# Patient Record
Sex: Female | Born: 1965
Health system: Southern US, Community
[De-identification: ages and names within clinical notes are randomized; demographics above are authoritative.]

## PROBLEM LIST (undated history)

## (undated) DIAGNOSIS — I201 Angina pectoris with documented spasm: Principal | ICD-10-CM

## (undated) HISTORY — DX: Angina pectoris with documented spasm: I20.1

---

## 1998-05-06 ENCOUNTER — Emergency Department (HOSPITAL_COMMUNITY): Admission: EM | Admit: 1998-05-06 | Discharge: 1998-05-06 | Payer: Self-pay | Admitting: Emergency Medicine

## 2000-02-17 ENCOUNTER — Inpatient Hospital Stay (HOSPITAL_COMMUNITY): Admission: AD | Admit: 2000-02-17 | Discharge: 2000-02-17 | Payer: Self-pay | Admitting: *Deleted

## 2000-02-27 ENCOUNTER — Inpatient Hospital Stay (HOSPITAL_COMMUNITY): Admission: AD | Admit: 2000-02-27 | Discharge: 2000-02-29 | Payer: Self-pay | Admitting: *Deleted

## 2000-05-24 ENCOUNTER — Encounter: Admission: RE | Admit: 2000-05-24 | Discharge: 2000-05-24 | Payer: Self-pay | Admitting: *Deleted

## 2000-05-24 ENCOUNTER — Encounter: Payer: Self-pay | Admitting: *Deleted

## 2001-02-19 ENCOUNTER — Emergency Department (HOSPITAL_COMMUNITY): Admission: EM | Admit: 2001-02-19 | Discharge: 2001-02-19 | Payer: Self-pay | Admitting: Emergency Medicine

## 2001-08-16 ENCOUNTER — Encounter: Payer: Self-pay | Admitting: *Deleted

## 2001-08-16 ENCOUNTER — Ambulatory Visit (HOSPITAL_COMMUNITY): Admission: RE | Admit: 2001-08-16 | Discharge: 2001-08-16 | Payer: Self-pay | Admitting: *Deleted

## 2004-07-03 ENCOUNTER — Other Ambulatory Visit: Admission: RE | Admit: 2004-07-03 | Discharge: 2004-07-03 | Payer: Self-pay | Admitting: Family Medicine

## 2004-07-16 ENCOUNTER — Ambulatory Visit (HOSPITAL_COMMUNITY): Admission: RE | Admit: 2004-07-16 | Discharge: 2004-07-16 | Payer: Self-pay | Admitting: Family Medicine

## 2005-02-02 ENCOUNTER — Other Ambulatory Visit: Admission: RE | Admit: 2005-02-02 | Discharge: 2005-02-02 | Payer: Self-pay | Admitting: Family Medicine

## 2005-07-22 ENCOUNTER — Ambulatory Visit (HOSPITAL_COMMUNITY): Admission: RE | Admit: 2005-07-22 | Discharge: 2005-07-22 | Payer: Self-pay | Admitting: Family Medicine

## 2006-07-27 ENCOUNTER — Ambulatory Visit (HOSPITAL_COMMUNITY): Admission: RE | Admit: 2006-07-27 | Discharge: 2006-07-27 | Payer: Self-pay | Admitting: Family Medicine

## 2007-08-15 ENCOUNTER — Ambulatory Visit (HOSPITAL_COMMUNITY): Admission: RE | Admit: 2007-08-15 | Discharge: 2007-08-15 | Payer: Self-pay | Admitting: Family Medicine

## 2008-07-06 ENCOUNTER — Other Ambulatory Visit: Admission: RE | Admit: 2008-07-06 | Discharge: 2008-07-06 | Payer: Self-pay | Admitting: Family Medicine

## 2008-08-29 ENCOUNTER — Ambulatory Visit (HOSPITAL_COMMUNITY): Admission: RE | Admit: 2008-08-29 | Discharge: 2008-08-29 | Payer: Self-pay | Admitting: Family Medicine

## 2009-07-15 ENCOUNTER — Other Ambulatory Visit: Admission: RE | Admit: 2009-07-15 | Discharge: 2009-07-15 | Payer: Self-pay | Admitting: Family Medicine

## 2009-09-20 ENCOUNTER — Ambulatory Visit (HOSPITAL_COMMUNITY): Admission: RE | Admit: 2009-09-20 | Discharge: 2009-09-20 | Payer: Self-pay | Admitting: Family Medicine

## 2010-07-17 ENCOUNTER — Other Ambulatory Visit: Admission: RE | Admit: 2010-07-17 | Discharge: 2010-07-17 | Payer: Self-pay | Admitting: Family Medicine

## 2010-09-18 ENCOUNTER — Other Ambulatory Visit (HOSPITAL_COMMUNITY): Payer: Self-pay | Admitting: Family Medicine

## 2010-09-18 DIAGNOSIS — Z0000 Encounter for general adult medical examination without abnormal findings: Secondary | ICD-10-CM

## 2010-09-18 DIAGNOSIS — Z1231 Encounter for screening mammogram for malignant neoplasm of breast: Secondary | ICD-10-CM

## 2010-09-21 ENCOUNTER — Encounter: Payer: Self-pay | Admitting: Family Medicine

## 2010-10-01 ENCOUNTER — Inpatient Hospital Stay (HOSPITAL_COMMUNITY): Admission: RE | Admit: 2010-10-01 | Payer: Self-pay | Source: Ambulatory Visit

## 2010-10-01 ENCOUNTER — Ambulatory Visit (HOSPITAL_COMMUNITY)
Admission: RE | Admit: 2010-10-01 | Discharge: 2010-10-01 | Disposition: A | Discharge status: Home / Self Care | Payer: Self-pay | Source: Ambulatory Visit | Attending: Family Medicine | Admitting: Family Medicine

## 2010-10-01 ENCOUNTER — Ambulatory Visit (HOSPITAL_COMMUNITY): Admission: RE | Admit: 2010-10-01 | Payer: Self-pay | Source: Home / Self Care | Admitting: Family Medicine

## 2010-10-01 DIAGNOSIS — Z0000 Encounter for general adult medical examination without abnormal findings: Secondary | ICD-10-CM

## 2010-10-27 ENCOUNTER — Ambulatory Visit (HOSPITAL_COMMUNITY): Payer: Self-pay

## 2010-11-04 ENCOUNTER — Other Ambulatory Visit (HOSPITAL_COMMUNITY): Payer: Self-pay | Admitting: Family Medicine

## 2010-11-04 ENCOUNTER — Ambulatory Visit (HOSPITAL_COMMUNITY)
Admission: RE | Admit: 2010-11-04 | Discharge: 2010-11-04 | Disposition: A | Discharge status: Home / Self Care | Payer: BC Managed Care – PPO | Source: Ambulatory Visit

## 2010-11-04 ENCOUNTER — Ambulatory Visit (HOSPITAL_COMMUNITY)
Admission: RE | Admit: 2010-11-04 | Discharge: 2010-11-04 | Disposition: A | Discharge status: Home / Self Care | Payer: BC Managed Care – PPO | Source: Ambulatory Visit | Attending: Family Medicine | Admitting: Family Medicine

## 2010-11-04 DIAGNOSIS — Z1231 Encounter for screening mammogram for malignant neoplasm of breast: Secondary | ICD-10-CM

## 2010-11-06 ENCOUNTER — Other Ambulatory Visit: Payer: Self-pay | Admitting: Family Medicine

## 2010-11-06 DIAGNOSIS — R928 Other abnormal and inconclusive findings on diagnostic imaging of breast: Secondary | ICD-10-CM

## 2010-11-14 ENCOUNTER — Other Ambulatory Visit: Payer: Self-pay | Admitting: Family Medicine

## 2010-11-14 ENCOUNTER — Ambulatory Visit
Admission: RE | Admit: 2010-11-14 | Discharge: 2010-11-14 | Disposition: A | Discharge status: Home / Self Care | Payer: BC Managed Care – PPO | Source: Ambulatory Visit | Attending: Family Medicine | Admitting: Family Medicine

## 2010-11-14 DIAGNOSIS — R928 Other abnormal and inconclusive findings on diagnostic imaging of breast: Secondary | ICD-10-CM

## 2011-01-16 NOTE — Discharge Summary (Signed)
[facility]  Patient:    Selena Vega, Selena Vega              MRN: [id number] Adm. Date:  [id number] Disc. Date: [id number] Attending:  Ardeen Fillers                           Discharge Summary  DISCHARGE DIAGNOSES:          1. Intrauterine pregnancy at [redacted] weeks                                  gestational age.                               2. Footling breech presentation.                               3. Rh positive.                               4. Advanced maternal age.  OPERATIVE PROCEDURES:         Primary low transverse cesarean section on February 27, 2000.  HISTORY OF PRESENT ILLNESS:   A 45 year old woman, G3, P0-0-2-0, [facility] March 07, 2000, with dates established by 8 week ultrasound, admitted for primary cesarean section because of persistent breech presentation.  External cephalic version was planned, but because of a nuchal cord, this was canceled.  The breech has been confirmed as recently as February 26, 2000.  The antenatal course was also remarkable for:  1. Small for gestational age fetus.  The ultrasound estimated fetal weight on February 12, 2000, at the 36th percentile.  The ultrasound on February 26, 2000, was in the 17th percentile. The patient reported good fetal activity.  2. Recurrent urinary tract infections during pregnancy.  The patient had been maintained on Macrobid daily prophylactically.  3. Advanced maternal age.  Amniocentesis showed normal female chromosomes.  HOSPITAL COURSE:  The patient was admitted on February 27, 2000, and taken to the operating room where she underwent primary low transverse cesarean section by Sung Amabile. Roslyn Smiling, M.D., and Genia Del, M.D., under spinal anesthesia. The estimated blood loss was 600 cc and there were no complications.  The baby was a live footling breech female, 2790 g, with Apgars of 8 and 9.  Clear fluid was noted.  The uterus was consistent with possible arcuate configuration versus  uterine distention secondary to breech presentation with the head prominent on one side of the fundus.  The patients postoperative was essentially unremarkable.  She remained afebrile.  Her hemoglobin stabilized at 11.5.  The postpartum platelet count was 138,000, but it was in the normal range prior to surgery.  Diet was advanced without difficulty.  DISPOSITION:                  She was discharged to home on the second postoperative day.  She was given routine status post C-section instructions.  FOLLOW-UP:                    Follow-up will be in the office in four to six weeks.  DISCHARGE MEDICATIONS:        Prenatal vitamins and nonsteroidal anti-inflammatory  medication. DD:  03/31/00 TD:  04/01/00 Job: [id number]

## 2011-01-16 NOTE — H&P (Signed)
[facility]  Patient:    Selena Vega, PETRENKO              MRN: [id number] Adm. Date:  02/27/00 Attending:  Sung Amabile. Roslyn Smiling, M.D.                         History and Physical  DATE OF BIRTH:                20-Jun-1966  CHIEF COMPLAINT:              Persistent breech presentation at term, small for gestational age fetus.  HISTORY OF PRESENT ILLNESS:   A 45 year old, woman, G3, P0-0-2-0, [facility] March 07, 2000 with dates established by eight week ultrasound, admitted for primary cesarean section because of persistent breech presentation.  Version was planned but, because of nuchal cord, this was cancelled.  The breech has been confirmed as recently as February 26, 2000.  Antenatal course has also been remarkable for: 1. Small for gestational age fetus.  Ultrasound estimated fetal weight on    February 12, 2000 when the breech was identified in the 32% percentile.    Ultrasound on February 26, 2000 was in the 17th percentile.  The patient    reports good fetal activity. 2. Recurrent urinary tract infections during the pregnancy.  Maintained on    Macrobid q.d. prophylactically. 3. Advanced maternal age.  Amniocentesis has shown normal female chromosomes.  PAST MEDICAL HISTORY:         Medical migraines.  Recurrent urinary tract infections.  PAST SURGICAL HISTORY:        First trimester therapeutic abortions times two, in 1984 and 1998.  ALLERGIES:                    Sulfa.  MEDICATIONS:                  Prenatal vitamins and Macrobid.  FAMILY HISTORY:               Father with history of alcoholism.  Mother and sister with hypertension.  Sister with questionable hypothyroidism.  SOCIAL HISTORY:               Married.  [employer]. Denies tobacco or ethanol use.  PHYSICAL EXAMINATION:  GENERAL:                      Healthy appearing gravid woman.  VITAL SIGNS:                  Afebrile.  Vital signs stable.  HEENT:                         Within normal limits.  NECK:                         Without thyromegaly.  CHEST:                        Clear.  CARDIOVASCULAR:               Regular rate and rhythm. S1 and S2 normal.  ABDOMEN:                      Soft and nontender.  Estimated fetal weight 7 pounds.  Breech by Thayer Ohm maneuver.  PELVIC:  Cervix closed, long and presenting part out of the pelvis.  EXTREMITIES:                  Without clubbing, cyanosis or edema.  NEUROLOGIC:                   Grossly intact, 2+ DTRs without clonus.  LABORATORY DATA:              Antenatal labs:  A positive.  RPR nonreactive. Rubella immune.  Hepatitis B surface antigen negative.  HIV nonreactive. Normal amniotic fluid.  AFP.  Glucola 135 mg%.  Group B strep negative.  IMPRESSION:                   1. Intrauterine pregnancy at 28 and 6/[redacted] weeks                                  gestational age.                               2. Breech presentation for delivery by                                  cesarean section at term.                               3. Rh positive.                               4. History of recurrent urinary tract                                  infections - stable receiving Macrobid                                  prophylaxis.                               5. Advanced maternal age - normal chromosomes.  PLAN:                         Primary low transverse cesarean section.  The patient has been counselled regarding benefits, risks and options and expected outcome of cesarean delivery. Questions have been answered. DD:  02/27/00 TD:  02/27/00 Job: [id number]

## 2011-01-16 NOTE — Op Note (Signed)
[facility]  Patient:    Selena Vega, Selena Vega              MRN: [id number] Proc. Date: 02/27/00 Adm. Date:  [id number] Attending:  Ardeen Fillers                           Operative Report  PREOPERATIVE DIAGNOSIS:       Breech presentation.  Intrauterine pregnancy at 38-6/[redacted] weeks gestational age.  POSTOPERATIVE DIAGNOSIS:      Breech presentation.  Intrauterine pregnancy at 38-6/[redacted] weeks gestational age.  (Footling).  Possible uterine anomaly.  OPERATION:                    Primary low transverse cesarean section.  SURGEON:                      Sung Amabile. Roslyn Smiling, M.D.  ASSISTANT:                    Genia Del, M.D.  ANESTHESIA:                   Spinal.  ESTIMATED BLOOD LOSS:         600 cc.  TUBES AND DRAINS:             Foley.  COMPLICATIONS:                None.  FINDINGS:                     Live footling breech female, 2790 grams with Apgars of 8 and 9.  Clear fluid.  Normal tubes and ovaries.  The uterus consistent with possible arcuate configuration versus fundal distention secondary to fetal head. Normal appearing placenta.  Three vessel cord.  SPECIMEN:                     Cord bloods.  INDICATIONS:                  45 year old woman, gravida 3, para 0-0-2-0, admitted for primary cesarean section because of breech presentation.  DESCRIPTION OF PROCEDURE:     After the establishment of spinal anesthesia, the  patient was prepped, Foley catheter was placed, and the patient was draped in the left uterine displacement position.  The Pfannenstiel incision was made on the kin with the knife.  The incision was carried to the level of the fascia using electrocautery.  The fascia was nicked in the midline.  The fascial incision was carried laterally using scissors.  The fascia was separated from the underlying  rectus muscle bellies.  The rectus muscle bellies were split in the midline sharply.  The peritoneum was  grasped in a clear space and entered there bluntly. The peritoneal incision was carried superiorly and inferiorly taking care to avoid injury to underlying viscera.  The bladder blade was placed.  The uterine position was ascertained.  The lower uterine segment was thick.  A low transverse incision was made with the knife.  The incision was carried laterally with bandage scissors. The membranes were ruptured with clear fluid.  The baby was delivered as a double footling breech without difficulty.  There was no obvious nuchal cord.  The cord was clamped and cut and the nasopharynx of the fetus was suctioned.  The baby was handed off the table to the awaiting pediatricians.  Cord  bloods were taken. The placenta was removed spontaneously.  The uterus was exteriorized and inspected.  The uterine cavity showed that no evidence of septum, but showed prominent areas in the fundus bilaterally consistent with either an arcuate uterus or uterine distention on one side because of the fetal head.  The bladder blade was placed. Uterine angles were identified with T clamps.  The uterus was closed in layers.  Prior to closure, the cervix was dilated with a Kelly clamp.  The first layer closure was a running interlocking stitch of 0 Monocryl.  The second layer was n imbricating stitch of 0 Monocryl.  The uterus was replaced in the abdominal cavity. Hemostasis was noted.  The peritoneal cavity was irrigated with warm normal saline. Initial sponge, needle, and instrument counts were correct.  The anterior abdominal wall was closed in layers.  The rectus muscle bellies were reapproximated in the midline lower portion with two mattress sutures of 0 Vicryl.  Subfascial layer as irrigated with warm normal saline.  Electrocautery was used for hemostasis. The fascia was closed from either angle using a running stitch of 0 Vicryl tying in the midline independently.  The fat was irrigated with warm  normal saline.  The electrocautery was used for hemostasis.  The skin edges were infiltrated with 0.25% Marcaine.  The skin was closed with staples.  Final sponge, needle, and instrument counts were correct.  The urine was clear at the end of the case.  The patient as transported to the recovery room in satisfactory condition. DD:  02/27/00 TD:  02/28/00 Job: [id number]

## 2011-10-05 ENCOUNTER — Other Ambulatory Visit (HOSPITAL_COMMUNITY): Payer: Self-pay | Admitting: Family Medicine

## 2011-10-05 DIAGNOSIS — Z1231 Encounter for screening mammogram for malignant neoplasm of breast: Secondary | ICD-10-CM

## 2011-11-20 ENCOUNTER — Ambulatory Visit (HOSPITAL_COMMUNITY): Payer: BC Managed Care – PPO

## 2011-12-15 ENCOUNTER — Ambulatory Visit (HOSPITAL_COMMUNITY)
Admission: RE | Admit: 2011-12-15 | Discharge: 2011-12-15 | Disposition: A | Discharge status: Home / Self Care | Payer: BC Managed Care – PPO | Source: Ambulatory Visit | Attending: Family Medicine | Admitting: Family Medicine

## 2011-12-15 DIAGNOSIS — Z1231 Encounter for screening mammogram for malignant neoplasm of breast: Secondary | ICD-10-CM | POA: Insufficient documentation

## 2012-02-24 ENCOUNTER — Other Ambulatory Visit: Payer: Self-pay | Admitting: Family Medicine

## 2012-02-24 DIAGNOSIS — N63 Unspecified lump in unspecified breast: Secondary | ICD-10-CM

## 2012-02-29 ENCOUNTER — Ambulatory Visit
Admission: RE | Admit: 2012-02-29 | Discharge: 2012-02-29 | Disposition: A | Discharge status: Home / Self Care | Payer: BC Managed Care – PPO | Source: Ambulatory Visit | Attending: Family Medicine | Admitting: Family Medicine

## 2012-02-29 DIAGNOSIS — N63 Unspecified lump in unspecified breast: Secondary | ICD-10-CM

## 2012-10-27 ENCOUNTER — Other Ambulatory Visit: Payer: Self-pay

## 2012-10-27 DIAGNOSIS — Z1231 Encounter for screening mammogram for malignant neoplasm of breast: Secondary | ICD-10-CM

## 2012-12-19 ENCOUNTER — Ambulatory Visit: Payer: BC Managed Care – PPO

## 2013-01-30 ENCOUNTER — Ambulatory Visit: Payer: BC Managed Care – PPO

## 2013-02-03 ENCOUNTER — Ambulatory Visit: Payer: BC Managed Care – PPO

## 2013-03-09 ENCOUNTER — Ambulatory Visit: Payer: BC Managed Care – PPO

## 2013-04-03 ENCOUNTER — Ambulatory Visit: Payer: BC Managed Care – PPO

## 2013-04-21 ENCOUNTER — Other Ambulatory Visit (HOSPITAL_COMMUNITY): Payer: Self-pay | Admitting: Family Medicine

## 2013-04-21 DIAGNOSIS — Z1231 Encounter for screening mammogram for malignant neoplasm of breast: Secondary | ICD-10-CM

## 2013-04-25 ENCOUNTER — Other Ambulatory Visit (HOSPITAL_COMMUNITY): Payer: Self-pay | Admitting: Family Medicine

## 2013-04-25 ENCOUNTER — Ambulatory Visit (HOSPITAL_COMMUNITY)
Admission: RE | Admit: 2013-04-25 | Discharge: 2013-04-25 | Disposition: A | Discharge status: Home / Self Care | Payer: BC Managed Care – PPO | Source: Ambulatory Visit | Attending: Family Medicine | Admitting: Family Medicine

## 2013-04-25 DIAGNOSIS — Z1231 Encounter for screening mammogram for malignant neoplasm of breast: Secondary | ICD-10-CM | POA: Insufficient documentation

## 2014-01-17 ENCOUNTER — Other Ambulatory Visit (HOSPITAL_COMMUNITY)
Admission: RE | Admit: 2014-01-17 | Discharge: 2014-01-17 | Disposition: A | Discharge status: Home / Self Care | Payer: BC Managed Care – PPO | Source: Ambulatory Visit | Attending: Family Medicine | Admitting: Family Medicine

## 2014-01-17 DIAGNOSIS — Z124 Encounter for screening for malignant neoplasm of cervix: Secondary | ICD-10-CM | POA: Insufficient documentation

## 2014-01-18 ENCOUNTER — Other Ambulatory Visit: Payer: Self-pay | Admitting: Family Medicine

## 2014-03-22 ENCOUNTER — Other Ambulatory Visit (HOSPITAL_COMMUNITY): Payer: Self-pay | Admitting: Family Medicine

## 2014-03-22 DIAGNOSIS — Z1231 Encounter for screening mammogram for malignant neoplasm of breast: Secondary | ICD-10-CM

## 2014-04-26 ENCOUNTER — Ambulatory Visit (HOSPITAL_COMMUNITY): Payer: BC Managed Care – PPO

## 2014-04-27 ENCOUNTER — Ambulatory Visit (HOSPITAL_COMMUNITY)
Admission: RE | Admit: 2014-04-27 | Discharge: 2014-04-27 | Disposition: A | Discharge status: Home / Self Care | Payer: BC Managed Care – PPO | Source: Ambulatory Visit | Attending: Family Medicine | Admitting: Family Medicine

## 2014-04-27 DIAGNOSIS — Z1231 Encounter for screening mammogram for malignant neoplasm of breast: Secondary | ICD-10-CM | POA: Diagnosis not present

## 2015-04-16 ENCOUNTER — Other Ambulatory Visit (HOSPITAL_COMMUNITY): Payer: Self-pay | Admitting: Family Medicine

## 2015-04-16 DIAGNOSIS — Z1231 Encounter for screening mammogram for malignant neoplasm of breast: Secondary | ICD-10-CM

## 2015-05-02 ENCOUNTER — Ambulatory Visit (HOSPITAL_COMMUNITY): Payer: Self-pay

## 2015-05-03 ENCOUNTER — Ambulatory Visit (HOSPITAL_COMMUNITY): Payer: BLUE CROSS/BLUE SHIELD

## 2015-05-27 ENCOUNTER — Ambulatory Visit (HOSPITAL_COMMUNITY): Payer: BLUE CROSS/BLUE SHIELD | Attending: Family Medicine

## 2015-06-26 ENCOUNTER — Other Ambulatory Visit (HOSPITAL_COMMUNITY): Payer: Self-pay | Admitting: Family Medicine

## 2015-06-26 ENCOUNTER — Ambulatory Visit
Admission: RE | Admit: 2015-06-26 | Discharge: 2015-06-26 | Disposition: A | Discharge status: Home / Self Care | Payer: BLUE CROSS/BLUE SHIELD | Source: Ambulatory Visit | Attending: Family Medicine | Admitting: Family Medicine

## 2015-06-26 DIAGNOSIS — N6320 Unspecified lump in the left breast, unspecified quadrant: Secondary | ICD-10-CM

## 2015-06-26 DIAGNOSIS — Z1231 Encounter for screening mammogram for malignant neoplasm of breast: Secondary | ICD-10-CM

## 2015-07-19 ENCOUNTER — Other Ambulatory Visit (HOSPITAL_COMMUNITY)
Admission: RE | Admit: 2015-07-19 | Discharge: 2015-07-19 | Disposition: A | Discharge status: Home / Self Care | Payer: BLUE CROSS/BLUE SHIELD | Source: Ambulatory Visit | Attending: Family Medicine | Admitting: Family Medicine

## 2015-07-19 ENCOUNTER — Other Ambulatory Visit: Payer: Self-pay | Admitting: Family Medicine

## 2015-07-19 DIAGNOSIS — Z124 Encounter for screening for malignant neoplasm of cervix: Secondary | ICD-10-CM | POA: Insufficient documentation

## 2015-07-23 LAB — CYTOLOGY - PAP

## 2015-12-11 DIAGNOSIS — N921 Excessive and frequent menstruation with irregular cycle: Secondary | ICD-10-CM | POA: Diagnosis not present

## 2015-12-23 DIAGNOSIS — N951 Menopausal and female climacteric states: Secondary | ICD-10-CM | POA: Diagnosis not present

## 2015-12-23 DIAGNOSIS — E559 Vitamin D deficiency, unspecified: Secondary | ICD-10-CM | POA: Diagnosis not present

## 2016-06-07 DIAGNOSIS — N390 Urinary tract infection, site not specified: Secondary | ICD-10-CM | POA: Diagnosis not present

## 2016-08-17 ENCOUNTER — Other Ambulatory Visit: Payer: Self-pay | Admitting: Family Medicine

## 2016-08-17 DIAGNOSIS — Z1231 Encounter for screening mammogram for malignant neoplasm of breast: Secondary | ICD-10-CM

## 2016-09-21 ENCOUNTER — Ambulatory Visit: Payer: BLUE CROSS/BLUE SHIELD

## 2016-10-08 ENCOUNTER — Ambulatory Visit
Admission: RE | Admit: 2016-10-08 | Discharge: 2016-10-08 | Disposition: A | Discharge status: Home / Self Care | Payer: BLUE CROSS/BLUE SHIELD | Source: Ambulatory Visit | Attending: Family Medicine | Admitting: Family Medicine

## 2016-10-08 ENCOUNTER — Other Ambulatory Visit: Payer: Self-pay | Admitting: Family Medicine

## 2016-10-08 DIAGNOSIS — Z1231 Encounter for screening mammogram for malignant neoplasm of breast: Secondary | ICD-10-CM

## 2016-10-08 DIAGNOSIS — N6489 Other specified disorders of breast: Secondary | ICD-10-CM | POA: Diagnosis not present

## 2016-10-08 DIAGNOSIS — N644 Mastodynia: Secondary | ICD-10-CM | POA: Diagnosis not present

## 2016-10-08 DIAGNOSIS — R922 Inconclusive mammogram: Secondary | ICD-10-CM | POA: Diagnosis not present

## 2016-10-08 DIAGNOSIS — N6310 Unspecified lump in the right breast, unspecified quadrant: Secondary | ICD-10-CM

## 2016-12-14 DIAGNOSIS — L814 Other melanin hyperpigmentation: Secondary | ICD-10-CM | POA: Diagnosis not present

## 2016-12-14 DIAGNOSIS — D235 Other benign neoplasm of skin of trunk: Secondary | ICD-10-CM | POA: Diagnosis not present

## 2016-12-29 DIAGNOSIS — N3000 Acute cystitis without hematuria: Secondary | ICD-10-CM | POA: Diagnosis not present

## 2016-12-29 DIAGNOSIS — R350 Frequency of micturition: Secondary | ICD-10-CM | POA: Diagnosis not present

## 2017-01-27 DIAGNOSIS — K648 Other hemorrhoids: Secondary | ICD-10-CM | POA: Diagnosis not present

## 2017-01-27 DIAGNOSIS — Z1211 Encounter for screening for malignant neoplasm of colon: Secondary | ICD-10-CM | POA: Diagnosis not present

## 2017-01-27 DIAGNOSIS — K5730 Diverticulosis of large intestine without perforation or abscess without bleeding: Secondary | ICD-10-CM | POA: Diagnosis not present

## 2017-02-03 DIAGNOSIS — R3915 Urgency of urination: Secondary | ICD-10-CM | POA: Diagnosis not present

## 2017-03-30 ENCOUNTER — Emergency Department (HOSPITAL_COMMUNITY): Payer: BLUE CROSS/BLUE SHIELD

## 2017-03-30 ENCOUNTER — Inpatient Hospital Stay (HOSPITAL_COMMUNITY)
Admission: EM | Admit: 2017-03-30 | Discharge: 2017-04-02 | DRG: 282 | Disposition: A | Discharge status: Home / Self Care | Payer: BLUE CROSS/BLUE SHIELD | Attending: Internal Medicine | Admitting: Internal Medicine

## 2017-03-30 ENCOUNTER — Encounter (HOSPITAL_COMMUNITY): Payer: Self-pay

## 2017-03-30 DIAGNOSIS — Z79899 Other long term (current) drug therapy: Secondary | ICD-10-CM | POA: Diagnosis not present

## 2017-03-30 DIAGNOSIS — Z881 Allergy status to other antibiotic agents status: Secondary | ICD-10-CM

## 2017-03-30 DIAGNOSIS — Z8249 Family history of ischemic heart disease and other diseases of the circulatory system: Secondary | ICD-10-CM | POA: Diagnosis not present

## 2017-03-30 DIAGNOSIS — I200 Unstable angina: Secondary | ICD-10-CM | POA: Diagnosis not present

## 2017-03-30 DIAGNOSIS — E876 Hypokalemia: Secondary | ICD-10-CM | POA: Diagnosis present

## 2017-03-30 DIAGNOSIS — K5730 Diverticulosis of large intestine without perforation or abscess without bleeding: Secondary | ICD-10-CM | POA: Diagnosis not present

## 2017-03-30 DIAGNOSIS — R748 Abnormal levels of other serum enzymes: Secondary | ICD-10-CM | POA: Diagnosis not present

## 2017-03-30 DIAGNOSIS — I2510 Atherosclerotic heart disease of native coronary artery without angina pectoris: Secondary | ICD-10-CM | POA: Diagnosis present

## 2017-03-30 DIAGNOSIS — R000 Tachycardia, unspecified: Secondary | ICD-10-CM | POA: Diagnosis present

## 2017-03-30 DIAGNOSIS — D259 Leiomyoma of uterus, unspecified: Secondary | ICD-10-CM | POA: Diagnosis not present

## 2017-03-30 DIAGNOSIS — R7989 Other specified abnormal findings of blood chemistry: Secondary | ICD-10-CM | POA: Diagnosis present

## 2017-03-30 DIAGNOSIS — I519 Heart disease, unspecified: Secondary | ICD-10-CM | POA: Diagnosis not present

## 2017-03-30 DIAGNOSIS — R778 Other specified abnormalities of plasma proteins: Secondary | ICD-10-CM | POA: Diagnosis present

## 2017-03-30 DIAGNOSIS — R079 Chest pain, unspecified: Secondary | ICD-10-CM | POA: Diagnosis present

## 2017-03-30 DIAGNOSIS — I214 Non-ST elevation (NSTEMI) myocardial infarction: Secondary | ICD-10-CM | POA: Diagnosis not present

## 2017-03-30 LAB — COMPREHENSIVE METABOLIC PANEL
ALT: 20 U/L (ref 14–54)
AST: 25 U/L (ref 15–41)
Albumin: 4.7 g/dL (ref 3.5–5.0)
Alkaline Phosphatase: 82 U/L (ref 38–126)
Anion gap: 16 — ABNORMAL HIGH (ref 5–15)
BUN: 10 mg/dL (ref 6–20)
CHLORIDE: 103 mmol/L (ref 101–111)
CO2: 20 mmol/L — ABNORMAL LOW (ref 22–32)
Calcium: 9.8 mg/dL (ref 8.9–10.3)
Creatinine, Ser: 0.63 mg/dL (ref 0.44–1.00)
Glucose, Bld: 117 mg/dL — ABNORMAL HIGH (ref 65–99)
POTASSIUM: 3.1 mmol/L — AB (ref 3.5–5.1)
Sodium: 139 mmol/L (ref 135–145)
Total Bilirubin: 0.6 mg/dL (ref 0.3–1.2)
Total Protein: 7.8 g/dL (ref 6.5–8.1)

## 2017-03-30 LAB — CBC WITH DIFFERENTIAL/PLATELET
Basophils Absolute: 0 10*3/uL (ref 0.0–0.1)
Basophils Relative: 1 %
Eosinophils Absolute: 0.2 10*3/uL (ref 0.0–0.7)
Eosinophils Relative: 3 %
HEMATOCRIT: 43.5 % (ref 36.0–46.0)
HEMOGLOBIN: 15.1 g/dL — AB (ref 12.0–15.0)
LYMPHS ABS: 2.8 10*3/uL (ref 0.7–4.0)
LYMPHS PCT: 37 %
MCH: 33.3 pg (ref 26.0–34.0)
MCHC: 34.7 g/dL (ref 30.0–36.0)
MCV: 95.8 fL (ref 78.0–100.0)
Monocytes Absolute: 1 10*3/uL (ref 0.1–1.0)
Monocytes Relative: 13 %
NEUTROS ABS: 3.5 10*3/uL (ref 1.7–7.7)
NEUTROS PCT: 46 %
Platelets: 290 10*3/uL (ref 150–400)
RBC: 4.54 MIL/uL (ref 3.87–5.11)
RDW: 12.4 % (ref 11.5–15.5)
WBC: 7.6 10*3/uL (ref 4.0–10.5)

## 2017-03-30 LAB — TROPONIN I: Troponin I: 0.04 ng/mL (ref <0.03)

## 2017-03-30 LAB — I-STAT TROPONIN, ED: Troponin i, poc: 0.01 ng/mL (ref 0.00–0.08)

## 2017-03-30 LAB — LIPASE, BLOOD: LIPASE: 30 U/L (ref 11–51)

## 2017-03-30 MED ORDER — GI COCKTAIL ~~LOC~~
30.0000 mL | Freq: Once | ORAL | Status: AC
  Administered 2017-03-30: 30 mL via ORAL
  Filled 2017-03-30: qty 30

## 2017-03-30 MED ORDER — NITROGLYCERIN 0.4 MG SL SUBL
0.4000 mg | SUBLINGUAL_TABLET | SUBLINGUAL | Status: DC | PRN
  Administered 2017-03-31: 0.4 mg via SUBLINGUAL
  Filled 2017-03-30 (×3): qty 1

## 2017-03-30 MED ORDER — IOPAMIDOL (ISOVUE-370) INJECTION 76%
100.0000 mL | Freq: Once | INTRAVENOUS | Status: AC | PRN
  Administered 2017-03-30: 100 mL via INTRAVENOUS

## 2017-03-30 NOTE — ED Triage Notes (Signed)
Patient reports that she began having a sharp pain in the mid back that radiated into the mid chest. paatient rates pain 8/10. Patient continues to have pain and nausea.

## 2017-03-30 NOTE — ED Notes (Signed)
ED Provider at bedside. 

## 2017-03-30 NOTE — ED Provider Notes (Signed)
[facility]-EMERGENCY DEPT Provider Note   CSN: [id number] Arrival date & time: 03/30/17  1745     History   Chief Complaint Chief Complaint  Patient presents with  . Chest Pain  . Nausea    HPI Selena Vega is a 51 y.o. female.  HPI  51 year old female without sniffing a past medical history the presents to the emergency department with approximately an hour of acute onset sharp and dull pain that started between her shoulder blades and then wrapped around the left side of her chest and is now in her back and in her retrosternal area. No associated shortness of breath. Did have limited dizziness and is now having some nausea with it. Took a full dose of aspirin at home but no other medications. No history of atelectasis. No recent illnesses. She states that she did eat prior to this but no problems with that. She's never any problems or gallbladder or pancreas before. Doesn't know her family history secondary to her mom and adopted but she knows that her parents do not have a history of early heart disease. Takes no medications. No alcohol or drugs. No history of smoking. Does have a job where she travels a lot [place] but does not have any lower extremity swelling, calf pain or shortness of breath.  History reviewed. No pertinent past medical history.  There are no active problems to display for this patient.   Past Surgical History:  Procedure Laterality Date  . CESAREAN SECTION      OB History    No data available       Home Medications    Prior to Admission medications   Medication Sig Start Date End Date Taking? Authorizing Provider  tetrahydrozoline-zinc (VISINE-AC) 0.05-0.25 % ophthalmic solution Place 2 drops into both eyes 3 (three) times daily as needed.   Yes [provider]  fluconazole (DIFLUCAN) 150 MG tablet Take 150 mg by mouth once.    [provider]  ibuprofen (ADVIL,MOTRIN) 600 MG tablet Take  600 mg by mouth every 8 (eight) hours as needed for pain.    [provider]  Ivermectin (SKLICE) 0.5 % LOTN Sklice 0.5 % lotion    [provider]  nitrofurantoin, macrocrystal-monohydrate, (MACROBID) 100 MG capsule Take 100 mg by mouth 2 (two) times daily.    [provider]  phenazopyridine (PYRIDIUM) 100 MG tablet Take 100 mg by mouth 2 (two) times daily as needed.    [provider]    Family History No family history on file.  Social History Social History  Substance Use Topics  . Smoking status: Never Smoker  . Smokeless tobacco: Never Used  . Alcohol use Yes     Comment: wine daily     Allergies   Sulfa antibiotics   Review of Systems Review of Systems  All other systems reviewed and are negative.    Physical Exam Updated Vital Signs BP (!) 141/96 (BP Location: Right Arm)   Pulse 74   Temp 97.7 F (36.5 C) (Oral)   Resp 18   Ht 5\' 7"  (1.702 m)   Wt 68 kg (150 lb)   SpO2 98%   BMI 23.49 kg/m   Physical Exam  Constitutional: She is oriented to person, place, and time. She appears well-developed and well-nourished.  HENT:  Head: Normocephalic and atraumatic.  Eyes: Conjunctivae and EOM are normal.  Neck: Normal range of motion.  Cardiovascular: Normal rate and regular rhythm.  Pulmonary/Chest: No stridor. No respiratory distress.  Abdominal: Soft. She exhibits no distension.  Musculoskeletal: Normal range of motion. She exhibits no edema or deformity.  Neurological: She is alert and oriented to person, place, and time. No cranial nerve deficit. Coordination normal.  Skin: Skin is warm and dry.  Nursing note and vitals reviewed.    ED Treatments / Results  Labs (all labs ordered are listed, but only abnormal results are displayed) Labs Reviewed  COMPREHENSIVE METABOLIC PANEL - Abnormal; Notable for the following:       Result Value   Potassium 3.1 (*)    CO2 20 (*)    Glucose, Bld 117 (*)    Anion gap 16 (*)      All other components within normal limits  CBC WITH DIFFERENTIAL/PLATELET - Abnormal; Notable for the following:    Hemoglobin 15.1 (*)    All other components within normal limits  TROPONIN I - Abnormal; Notable for the following:    Troponin I 0.04 (*)    All other components within normal limits  LIPASE, BLOOD  APTT  PROTIME-INR  HEPARIN LEVEL (UNFRACTIONATED)  CBC  I-STAT TROPONIN, ED    EKG  EKG Interpretation  Date/Time:  Tuesday March 30 2017 21:27:48 EDT Ventricular Rate:  73 PR Interval:    QRS Duration: 82 QT Interval:  366 QTC Calculation: 404 R Axis:   85 Text Interpretation:  Sinus rhythm Low voltage, precordial leads Probable anteroseptal infarct, old improved T wave amplitude in anterior leads Confirmed by Marily Memos [phone]) on 03/30/2017 9:55:51 PM       Radiology Dg Chest 2 View  Result Date: 03/30/2017 CLINICAL DATA:  Chest pain EXAM: CHEST  2 VIEW COMPARISON:  None. FINDINGS: The heart size and mediastinal contours are within normal limits. Both lungs are clear. The visualized skeletal structures are unremarkable. IMPRESSION: No active cardiopulmonary disease. Electronically Signed   By: Marlan Palau M.D.   On: 03/30/2017 18:36   Ct Angio Chest/abd/pel For Dissection W And/or Wo Contrast  Result Date: 03/30/2017 CLINICAL DATA:  51 year old female with sharp pain in the chest. Evaluate for dissection. EXAM: CT ANGIOGRAPHY CHEST, ABDOMEN AND PELVIS TECHNIQUE: Multidetector CT imaging through the chest, abdomen and pelvis was performed using the standard protocol during bolus administration of intravenous contrast. Multiplanar reconstructed images and MIPs were obtained and reviewed to evaluate the vascular anatomy. CONTRAST:  100 cc Isovue 370 COMPARISON:  Chest radiograph dated 03/30/2017 FINDINGS: CTA CHEST FINDINGS Cardiovascular: There is no cardiomegaly or pericardial effusion. The thoracic aorta is unremarkable. No aneurysmal dilatation or dissection.  The origins of the great vessels of the aortic arch appear patent. No CT evidence of central pulmonary artery embolus. Mediastinum/Nodes: There is no hilar or mediastinal adenopathy. The esophagus and the thyroid gland are grossly unremarkable. No mediastinal fluid collection or hematoma. Lungs/Pleura: The lungs are clear. There is no pleural effusion or pneumothorax. The central airways are patent. Musculoskeletal: No chest wall abnormality. No acute or significant osseous findings. Review of the MIP images confirms the above findings. CTA ABDOMEN AND PELVIS FINDINGS VASCULAR Aorta: Normal caliber aorta without aneurysm, dissection, vasculitis or significant stenosis. Celiac: There is focal aneurysmal dilatation of the celiac axis measuring up to 9 mm in diameter approximately 17 mm from the origin. No dissection. The celiac axis is patent. There is an accessory left hepatic artery arising from the left gastric artery. An accessory right hepatic artery arising from the SMA. SMA: Patent without evidence of aneurysm, dissection, vasculitis  or significant stenosis. Renals: Both renal arteries are patent without evidence of aneurysm, dissection, vasculitis, fibromuscular dysplasia or significant stenosis. IMA: Patent without evidence of aneurysm, dissection, vasculitis or significant stenosis. Inflow: Patent without evidence of aneurysm, dissection, vasculitis or significant stenosis. Veins: No obvious venous abnormality within the limitations of this arterial phase study. Review of the MIP images confirms the above findings. NON-VASCULAR No intra-abdominal free air or free fluid. Hepatobiliary: No focal liver abnormality is seen. No gallstones, gallbladder wall thickening, or biliary dilatation. Pancreas: Unremarkable. No pancreatic ductal dilatation or surrounding inflammatory changes. Spleen: Normal in size without focal abnormality. Adrenals/Urinary Tract: Adrenal glands are unremarkable. Kidneys are normal,  without renal calculi, focal lesion, or hydronephrosis. Bladder is unremarkable. Stomach/Bowel: Small sigmoid diverticula without active inflammatory changes. There is no evidence of bowel obstruction or active inflammation. Normal appendix. Lymphatic: No adenopathy. Reproductive: Ill-defined uterine fibroids. Ultrasound may provide better evaluation of the pelvic structures. The ovaries are grossly unremarkable. Other: None Musculoskeletal: Degenerative changes and sclerotic changes of the L4. Mild diffuse disc bulge at L4-5. No acute osseous pathology. Review of the MIP images confirms the above findings. IMPRESSION: 1. No acute intrathoracic, abdominal, or pelvic pathology. No aortic aneurysm or dissection. No CT evidence of central pulmonary artery embolus. 2. 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 3. Small scattered sigmoid diverticula. No active inflammation or bowel obstruction. Normal appendix. 4. Small uterine fibroids. Ultrasound may provide better evaluation of the pelvic structures. Electronically Signed   By: Elgie CollardArash  Radparvar M.D.   On: 03/30/2017 23:45    Procedures Procedures (including critical care time)  CRITICAL CARE Performed by: Marily MemosMesner, Jaylissa Felty Total critical care time: 35 minutes Critical care time was exclusive of separately billable procedures and treating other patients. Critical care was necessary to treat or prevent imminent or life-threatening deterioration. Critical care was time spent personally by me on the following activities: development of treatment plan with patient and/or surrogate as well as nursing, discussions with consultants, evaluation of patient's response to treatment, examination of patient, obtaining history from patient or surrogate, ordering and performing treatments and interventions, ordering and review of laboratory studies, ordering and review of radiographic studies, pulse oximetry and re-evaluation of  patient's condition.   Medications Ordered in ED Medications  nitroGLYCERIN (NITROSTAT) SL tablet 0.4 mg (not administered)  heparin bolus via infusion 4,000 Units (not administered)  heparin ADULT infusion 100 units/mL (25000 units/24350mL sodium chloride 0.45%) (not administered)  gi cocktail (Maalox,Lidocaine,Donnatal) (30 mLs Oral Given 03/30/17 1911)  iopamidol (ISOVUE-370) 76 % injection 100 mL (100 mLs Intravenous Contrast Given 03/30/17 2323)     Initial Impression / Assessment and Plan / ED Course  I have reviewed the triage vital signs and the nursing notes.  Pertinent labs & imaging results that were available during my care of the patient were reviewed by me and considered in my medical decision making (see chart for details).  Clinical Course as of Mar 31 29  Tue Mar 30, 2017  [id number] Reevaluation and pain is almost completely resolved and resolved prior to the GI cocktail. Gas? Constipation? Will continue to eval. Repeat troponin and ecg for ACS rule out.  [JM]    Clinical Course User Index [JM] Kalyse Meharg, Barbara CowerJason, MD     Seems GI versus cardiovascular in origin. We'll start there with labs, EKG and nitroglycerin with a GI cocktail. Your chest x-ray. She has equal pulses and no neurologic symptoms with a dissection is less  likely.  Repeat troponin slightly elevated in EKG showed bladder ST segments and less amplitude of T waves in anterior segments. Making suspicion for a possible ACS event little bit higher. I discussed the case with cardiology at [facility] and they felt that it a dissection study was negative the patient could be admitted here. If Troponins continue to rise or chest pain got worse that she can be transferred over there for catheterization.  Final Clinical Impressions(s) / ED Diagnoses   Final diagnoses:  Chest pain, unspecified type  NSTEMI (non-ST elevated myocardial infarction) (HCC)      Dazha Kempa, Barbara Cower, MD 03/31/17 0030

## 2017-03-31 ENCOUNTER — Encounter (HOSPITAL_COMMUNITY): Payer: Self-pay | Admitting: Internal Medicine

## 2017-03-31 ENCOUNTER — Encounter (HOSPITAL_COMMUNITY)
Admission: EM | Disposition: A | Discharge status: Home / Self Care | Payer: Self-pay | Source: Home / Self Care | Attending: Internal Medicine

## 2017-03-31 DIAGNOSIS — I519 Heart disease, unspecified: Secondary | ICD-10-CM | POA: Diagnosis not present

## 2017-03-31 DIAGNOSIS — I200 Unstable angina: Secondary | ICD-10-CM

## 2017-03-31 DIAGNOSIS — Z8249 Family history of ischemic heart disease and other diseases of the circulatory system: Secondary | ICD-10-CM | POA: Diagnosis not present

## 2017-03-31 DIAGNOSIS — I214 Non-ST elevation (NSTEMI) myocardial infarction: Secondary | ICD-10-CM | POA: Diagnosis not present

## 2017-03-31 DIAGNOSIS — R7989 Other specified abnormal findings of blood chemistry: Secondary | ICD-10-CM

## 2017-03-31 DIAGNOSIS — I2510 Atherosclerotic heart disease of native coronary artery without angina pectoris: Secondary | ICD-10-CM | POA: Diagnosis not present

## 2017-03-31 DIAGNOSIS — E876 Hypokalemia: Secondary | ICD-10-CM

## 2017-03-31 DIAGNOSIS — Z79899 Other long term (current) drug therapy: Secondary | ICD-10-CM | POA: Diagnosis not present

## 2017-03-31 DIAGNOSIS — Z881 Allergy status to other antibiotic agents status: Secondary | ICD-10-CM | POA: Diagnosis not present

## 2017-03-31 DIAGNOSIS — R079 Chest pain, unspecified: Secondary | ICD-10-CM | POA: Diagnosis present

## 2017-03-31 DIAGNOSIS — R778 Other specified abnormalities of plasma proteins: Secondary | ICD-10-CM | POA: Diagnosis present

## 2017-03-31 DIAGNOSIS — R748 Abnormal levels of other serum enzymes: Secondary | ICD-10-CM | POA: Diagnosis not present

## 2017-03-31 DIAGNOSIS — R000 Tachycardia, unspecified: Secondary | ICD-10-CM | POA: Diagnosis present

## 2017-03-31 HISTORY — PX: LEFT HEART CATH AND CORONARY ANGIOGRAPHY: CATH118249

## 2017-03-31 LAB — PROTIME-INR
INR: 1.02
PROTHROMBIN TIME: 13.5 s (ref 11.4–15.2)

## 2017-03-31 LAB — BASIC METABOLIC PANEL
ANION GAP: 12 (ref 5–15)
BUN: 5 mg/dL — ABNORMAL LOW (ref 6–20)
CHLORIDE: 108 mmol/L (ref 101–111)
CO2: 19 mmol/L — AB (ref 22–32)
Calcium: 8.9 mg/dL (ref 8.9–10.3)
Creatinine, Ser: 0.57 mg/dL (ref 0.44–1.00)
GFR calc non Af Amer: >60 mL/min (ref >60)
GLUCOSE: 103 mg/dL — AB (ref 65–99)
Potassium: 4.1 mmol/L (ref 3.5–5.1)
Sodium: 139 mmol/L (ref 135–145)

## 2017-03-31 LAB — TROPONIN I
TROPONIN I: 32.53 ng/mL — AB (ref <0.03)
TROPONIN I: 12.49 ng/mL — AB (ref <0.03)
Troponin I: 36.97 ng/mL (ref <0.03)
Troponin I: 14.34 ng/mL (ref <0.03)

## 2017-03-31 LAB — LIPID PANEL
CHOL/HDL RATIO: 2.4 ratio
CHOLESTEROL: 188 mg/dL (ref 0–200)
HDL: 77 mg/dL (ref >40)
LDL Cholesterol: 91 mg/dL (ref 0–99)
Triglycerides: 100 mg/dL (ref <150)
VLDL: 20 mg/dL (ref 0–40)

## 2017-03-31 LAB — MAGNESIUM: MAGNESIUM: 2 mg/dL (ref 1.7–2.4)

## 2017-03-31 LAB — APTT: aPTT: 29 seconds (ref 24–36)

## 2017-03-31 LAB — I-STAT TROPONIN, ED: TROPONIN I, POC: 23.45 ng/mL — AB (ref 0.00–0.08)

## 2017-03-31 LAB — MRSA PCR SCREENING: MRSA BY PCR: NEGATIVE

## 2017-03-31 LAB — PREGNANCY, URINE: Preg Test, Ur: NEGATIVE

## 2017-03-31 LAB — POCT ACTIVATED CLOTTING TIME: Activated Clotting Time: 103 seconds

## 2017-03-31 SURGERY — LEFT HEART CATH AND CORONARY ANGIOGRAPHY
Anesthesia: LOCAL

## 2017-03-31 MED ORDER — CLOPIDOGREL BISULFATE 75 MG PO TABS
75.0000 mg | ORAL_TABLET | Freq: Every day | ORAL | Status: DC
  Administered 2017-04-01 – 2017-04-02 (×2): 75 mg via ORAL
  Filled 2017-03-31 (×2): qty 1

## 2017-03-31 MED ORDER — HEPARIN BOLUS VIA INFUSION
4000.0000 [IU] | Freq: Once | INTRAVENOUS | Status: AC
  Administered 2017-03-31: 4000 [IU] via INTRAVENOUS
  Filled 2017-03-31: qty 4000

## 2017-03-31 MED ORDER — SODIUM CHLORIDE 0.9% FLUSH: 3.0000 mL | INTRAVENOUS | Status: DC | PRN

## 2017-03-31 MED ORDER — SODIUM CHLORIDE 0.9 % IV SOLN: 250.0000 mL | INTRAVENOUS | Status: DC | PRN

## 2017-03-31 MED ORDER — MIDAZOLAM HCL 2 MG/2ML IJ SOLN
INTRAMUSCULAR | Status: AC
  Filled 2017-03-31: qty 2

## 2017-03-31 MED ORDER — ASPIRIN 81 MG PO CHEW
81.0000 mg | CHEWABLE_TABLET | ORAL | Status: AC
  Administered 2017-03-31: 81 mg via ORAL

## 2017-03-31 MED ORDER — CLOPIDOGREL BISULFATE 300 MG PO TABS
ORAL_TABLET | ORAL | Status: AC
  Filled 2017-03-31: qty 1

## 2017-03-31 MED ORDER — SODIUM CHLORIDE 0.9% FLUSH
3.0000 mL | Freq: Two times a day (BID) | INTRAVENOUS | Status: DC
  Administered 2017-03-31 – 2017-04-02 (×4): 3 mL via INTRAVENOUS

## 2017-03-31 MED ORDER — SODIUM CHLORIDE 0.9 % WEIGHT BASED INFUSION: 1.0000 mL/kg/h | INTRAVENOUS | Status: AC

## 2017-03-31 MED ORDER — SODIUM CHLORIDE 0.9% FLUSH: 3.0000 mL | Freq: Two times a day (BID) | INTRAVENOUS | Status: DC

## 2017-03-31 MED ORDER — IOPAMIDOL (ISOVUE-370) INJECTION 76%
INTRAVENOUS | Status: DC | PRN
  Administered 2017-03-31: 60 mL via INTRA_ARTERIAL

## 2017-03-31 MED ORDER — VERAPAMIL HCL 2.5 MG/ML IV SOLN
INTRAVENOUS | Status: AC
  Filled 2017-03-31: qty 2

## 2017-03-31 MED ORDER — NITROGLYCERIN 2 % TD OINT
1.0000 [in_us] | TOPICAL_OINTMENT | Freq: Once | TRANSDERMAL | Status: AC
  Administered 2017-03-31: 1 [in_us] via TOPICAL
  Filled 2017-03-31: qty 1

## 2017-03-31 MED ORDER — HEPARIN SODIUM (PORCINE) 1000 UNIT/ML IJ SOLN
INTRAMUSCULAR | Status: AC
  Filled 2017-03-31: qty 1

## 2017-03-31 MED ORDER — METOPROLOL TARTRATE 5 MG/5ML IV SOLN
2.5000 mg | Freq: Once | INTRAVENOUS | Status: AC
  Administered 2017-03-31: 2.5 mg via INTRAVENOUS
  Filled 2017-03-31: qty 5

## 2017-03-31 MED ORDER — MORPHINE SULFATE (PF) 2 MG/ML IV SOLN: 2.0000 mg | INTRAVENOUS | Status: DC | PRN

## 2017-03-31 MED ORDER — SODIUM CHLORIDE 0.9 % WEIGHT BASED INFUSION: 1.0000 mL/kg/h | INTRAVENOUS | Status: DC

## 2017-03-31 MED ORDER — NITROGLYCERIN 1 MG/10 ML FOR IR/CATH LAB
INTRA_ARTERIAL | Status: DC | PRN
  Administered 2017-03-31: 200 ug via INTRACORONARY

## 2017-03-31 MED ORDER — ACETAMINOPHEN 325 MG PO TABS
650.0000 mg | ORAL_TABLET | ORAL | Status: DC | PRN
  Administered 2017-03-31: 650 mg via ORAL
  Filled 2017-03-31: qty 2

## 2017-03-31 MED ORDER — AMLODIPINE BESYLATE 5 MG PO TABS
5.0000 mg | ORAL_TABLET | Freq: Every day | ORAL | Status: DC
  Administered 2017-03-31 – 2017-04-02 (×3): 5 mg via ORAL
  Filled 2017-03-31 (×3): qty 1

## 2017-03-31 MED ORDER — ASPIRIN 81 MG PO CHEW
CHEWABLE_TABLET | ORAL | Status: AC
  Filled 2017-03-31: qty 1

## 2017-03-31 MED ORDER — CLOPIDOGREL BISULFATE 300 MG PO TABS
600.0000 mg | ORAL_TABLET | Freq: Once | ORAL | Status: AC
  Administered 2017-03-31: 600 mg via ORAL

## 2017-03-31 MED ORDER — SODIUM CHLORIDE 0.9 % WEIGHT BASED INFUSION: 3.0000 mL/kg/h | INTRAVENOUS | Status: DC

## 2017-03-31 MED ORDER — LIDOCAINE HCL (PF) 1 % IJ SOLN
INTRAMUSCULAR | Status: DC | PRN
Start: 2017-03-31 — End: 2017-03-31
  Administered 2017-03-31: 22 mL

## 2017-03-31 MED ORDER — FENTANYL CITRATE (PF) 100 MCG/2ML IJ SOLN
INTRAMUSCULAR | Status: AC
  Filled 2017-03-31: qty 2

## 2017-03-31 MED ORDER — SODIUM CHLORIDE 0.9 % WEIGHT BASED INFUSION
3.0000 mL/kg/h | INTRAVENOUS | Status: DC
  Administered 2017-03-31: 3 mL/kg/h via INTRAVENOUS

## 2017-03-31 MED ORDER — MIDAZOLAM HCL 2 MG/2ML IJ SOLN
INTRAMUSCULAR | Status: DC | PRN
  Administered 2017-03-31: 2 mg via INTRAVENOUS

## 2017-03-31 MED ORDER — ONDANSETRON HCL 4 MG/2ML IJ SOLN: 4.0000 mg | Freq: Four times a day (QID) | INTRAMUSCULAR | Status: DC | PRN

## 2017-03-31 MED ORDER — ASPIRIN 81 MG PO CHEW: 81.0000 mg | CHEWABLE_TABLET | ORAL | Status: DC

## 2017-03-31 MED ORDER — ATORVASTATIN CALCIUM 80 MG PO TABS
80.0000 mg | ORAL_TABLET | Freq: Every day | ORAL | Status: DC
  Filled 2017-03-31: qty 1

## 2017-03-31 MED ORDER — DIAZEPAM 5 MG PO TABS: 5.0000 mg | ORAL_TABLET | Freq: Four times a day (QID) | ORAL | Status: DC | PRN

## 2017-03-31 MED ORDER — FENTANYL CITRATE (PF) 100 MCG/2ML IJ SOLN
INTRAMUSCULAR | Status: DC | PRN
  Administered 2017-03-31: 25 ug via INTRAVENOUS

## 2017-03-31 MED ORDER — POTASSIUM CHLORIDE IN NACL 20-0.9 MEQ/L-% IV SOLN
INTRAVENOUS | Status: AC
  Administered 2017-03-31: 04:00:00 via INTRAVENOUS
  Filled 2017-03-31 (×2): qty 1000

## 2017-03-31 MED ORDER — LIDOCAINE HCL (PF) 1 % IJ SOLN
INTRAMUSCULAR | Status: AC
  Filled 2017-03-31: qty 30

## 2017-03-31 MED ORDER — ACETAMINOPHEN 325 MG PO TABS
650.0000 mg | ORAL_TABLET | Freq: Four times a day (QID) | ORAL | Status: DC | PRN
  Filled 2017-03-31: qty 2

## 2017-03-31 MED ORDER — HEPARIN (PORCINE) IN NACL 2-0.9 UNIT/ML-% IJ SOLN
INTRAMUSCULAR | Status: AC | PRN
  Administered 2017-03-31: 1000 mL

## 2017-03-31 MED ORDER — IOPAMIDOL (ISOVUE-370) INJECTION 76%
INTRAVENOUS | Status: AC
  Filled 2017-03-31: qty 100

## 2017-03-31 MED ORDER — POTASSIUM CHLORIDE CRYS ER 20 MEQ PO TBCR
40.0000 meq | EXTENDED_RELEASE_TABLET | Freq: Once | ORAL | Status: AC
  Administered 2017-03-31: 40 meq via ORAL
  Filled 2017-03-31: qty 2

## 2017-03-31 MED ORDER — HEPARIN (PORCINE) IN NACL 100-0.45 UNIT/ML-% IJ SOLN
800.0000 [IU]/h | INTRAMUSCULAR | Status: DC
  Administered 2017-03-31: 800 [IU]/h via INTRAVENOUS
  Filled 2017-03-31: qty 250

## 2017-03-31 MED ORDER — HEPARIN (PORCINE) IN NACL 2-0.9 UNIT/ML-% IJ SOLN
INTRAMUSCULAR | Status: AC
  Filled 2017-03-31: qty 1000

## 2017-03-31 MED ORDER — MAGNESIUM SULFATE 2 GM/50ML IV SOLN
2.0000 g | Freq: Once | INTRAVENOUS | Status: AC
  Administered 2017-03-31: 2 g via INTRAVENOUS
  Filled 2017-03-31: qty 50

## 2017-03-31 MED ORDER — NITROGLYCERIN 1 MG/10 ML FOR IR/CATH LAB
INTRA_ARTERIAL | Status: AC
  Filled 2017-03-31: qty 10

## 2017-03-31 MED ORDER — ASPIRIN 81 MG PO CHEW
81.0000 mg | CHEWABLE_TABLET | Freq: Every day | ORAL | Status: DC
  Administered 2017-04-01 – 2017-04-02 (×2): 81 mg via ORAL
  Filled 2017-03-31 (×2): qty 1

## 2017-03-31 SURGICAL SUPPLY — 12 items
CATH INFINITI 5FR MULTPACK ANG (CATHETERS) ×1 IMPLANT
COVER PRB 48X5XTLSCP FOLD TPE (BAG) IMPLANT
COVER PROBE 5X48 (BAG) ×2
GUIDEWIRE INQWIRE 1.5J.035X260 (WIRE) IMPLANT
INQWIRE 1.5J .035X260CM (WIRE) ×2
KIT HEART LEFT (KITS) ×2 IMPLANT
PACK CARDIAC CATHETERIZATION (CUSTOM PROCEDURE TRAY) ×2 IMPLANT
SHEATH PINNACLE 5F 10CM (SHEATH) ×1 IMPLANT
SYR MEDRAD MARK V 150ML (SYRINGE) ×2 IMPLANT
TRANSDUCER W/STOPCOCK (MISCELLANEOUS) ×2 IMPLANT
TUBING CIL FLEX 10 FLL-RA (TUBING) ×2 IMPLANT
WIRE EMERALD 3MM-J .035X150CM (WIRE) ×1 IMPLANT

## 2017-03-31 NOTE — Progress Notes (Signed)
Site area: Right groin a 5 french arterial sheath was removed  Site Prior to Removal:  Level 0  Pressure Applied For 20 MINUTES    Bedrest Beginning at 1125a  Manual:   Yes.    Patient Status During Pull:  stable  Post Pull Groin Site:  Level 0  Post Pull Instructions Given:  Yes.    Post Pull Pulses Present:  Yes.    Dressing Applied:  Yes.    Comments:  VS remain stable during sheath pull.

## 2017-03-31 NOTE — ED Notes (Signed)
Provider at bedside

## 2017-03-31 NOTE — ED Notes (Signed)
Date and time results received: 03/31/17 0203   Test: Troponin Critical Value: 36.97  Name of Provider Notified: Robb Matarrtiz, MD  Orders Received? Or Actions Taken?: Actions Taken: repeat EKG and recollect of troponin

## 2017-03-31 NOTE — Progress Notes (Signed)
CRITICAL VALUE ALERT  Critical Value:  Troponin 14.3  Date & Time Notied:  03/31/2017 1445  Provider Notified: tiffany greene  Orders Received/Actions taken: troponin decreasing, will continue to monitor.

## 2017-03-31 NOTE — Progress Notes (Addendum)
Patient seen and examined  51 year old female with no significant past medical history comes to the ER with sudden onset of sharp back pain radiating into the midchest. Found to have acute myocardial infarction, Troponin < 0.03 << 0.04 << 36.97 >> 32.53. Status postcardiac cath, received PCI, now on aspirin and Plavix, Lipitor, may need low-dose beta blocker to be added. Cath report shows possible vasospasm mid-distal LAD territory with  stunned myocardium ,started on amlodipine for potential antispasm benefit.

## 2017-03-31 NOTE — Care Management Note (Signed)
Case Management Note  Patient Details  Name: Selena Vega MRN: [id number] Date of Birth: 09/02/1965  Subjective/Objective:    Pt admitted with NSTEMI                Action/Plan:  Pt is independent from home.  CM will continue to follow for discharge needs   Expected Discharge Date:                  Expected Discharge Plan:  Home/Self Care  In-House Referral:     Discharge planning Services  CM Consult  Post Acute Care Choice:    Choice offered to:     DME Arranged:    DME Agency:     HH Arranged:    HH Agency:     Status of Service:  In process, will continue to follow  If discussed at Long Length of Stay Meetings, dates discussed:    Additional Comments:  Cherylann ParrClaxton, Jaan Fischel S, RN 03/31/2017, 3:57 PM

## 2017-03-31 NOTE — Consult Note (Addendum)
Cardiology Consultation:   Patient ID: Selena Vega; [id number]; 09/11/1965   Admit date: 03/30/2017 Date of Consult: 03/31/2017  Primary Care Provider: Patient, No Pcp Per Primary Cardiologist: New Primary Electrophysiologist:  None   Patient Profile:   Selena Vega is a 51 y.o. female with a hx of no medical problems.  The patient is being seen today for the evaluation of NSTEMI at the request of Dr. Susie CassetteAbrol.  History of Present Illness:   Selena LowJeannie E Vega is an over all very healthy female who works in the [employer] and drinks 4-5 glasses of wine a day. She reports having a good healthy diet and no family history of cardiac disease. After eating dinner last night she sat down at the computer to do some work and developed  very severe chest pain between her shoulder blades around 6pm, it was described as a spasm and a very sharp pain. She does have hx of shoulder problems and muscle spasms but this felt different. She told her husband she was hurting and he gave her an aspirin, her pain became unbearable and started to wrap around to her chest therefore she told him to take her to the emergency department. In the ER her initial Troponin was negative. The second one was slightly elevated at 0.04 and then the third one resulted at 36.97. She did have a dissection CT study that was negative for dissection or PE. She was started on Heparin and Nitro and [facility] She is now pain free, her 4th troponin has trended down to 23.45. She is well appearing,no distress at this time, pain free and hemodynamically stable.  EKG : HR 93, no ischemic changes.  Hemoglobin 15.1 Creatinine 0.63 Potassium 3.1, replaced by hospitalist   Blood pressure (!) 129/99, pulse 80, temperature 98.4 F (36.9 C), temperature source Oral, resp. rate 18, height 5\' 7"  (1.702 m), weight 148 lb 13 oz (67.5 kg), SpO2 100 %.   History reviewed. No pertinent  past medical history.  Past Surgical History:  Procedure Laterality Date  . CESAREAN SECTION       Inpatient Medications: Scheduled Meds: . [START ON 04/01/2017] aspirin  81 mg Oral Pre-Cath  . sodium chloride flush  3 mL Intravenous Q12H   Continuous Infusions: . sodium chloride    . 0.9 % NaCl with KCl 20 mEq / L 100 mL/hr at 03/31/17 0353  . [START ON 04/01/2017] sodium chloride     Followed by  . [START ON 04/01/2017] sodium chloride    . heparin 800 Units/hr (03/31/17 0042)   PRN Meds: sodium chloride, acetaminophen, morphine injection, nitroGLYCERIN, ondansetron (ZOFRAN) IV, sodium chloride flush  Allergies:    Allergies  Allergen Reactions  . Sulfa Antibiotics Hives    Social History:   Social History   Social History  . Marital status: Married    Spouse name: N/A  . Number of children: N/A  . Years of education: N/A   Occupational History  . Not on file.   Social History Main Topics  . Smoking status: Never Smoker  . Smokeless tobacco: Never Used  . Alcohol use Yes     Comment: wine daily  . Drug use: No  . Sexual activity: Not on file   Other Topics Concern  . Not on file   Social History Narrative  . No narrative on file      Family History: The patient's family history includes Heart disease in her sister; Hypothyroidism  in her sister; Liver cancer in her mother.  ROS:  Please see the history of present illness.  All other ROS reviewed and negative.     Physical Exam/Data:   Vitals:   04/17/17 0432 04/17/2017 0523 04/17/17 0525 17-Apr-2017 0701  BP: (!) 114/94  123/86 (!) 129/99  Pulse: 67  81 80  Resp: 17  11 18   Temp:  97.8 F (36.6 C)  98.4 F (36.9 C)  TempSrc:  Oral  Oral  SpO2: 96%  99% 100%  Weight:  148 lb (67.1 kg) 148 lb 13 oz (67.5 kg)   Height:   5\' 7"  (1.702 m)     Intake/Output Summary (Last 24 hours) at 2017/04/17 0757 Last data filed at 2017-04-17 0530  Gross per 24 hour  Intake           250.07 ml  Output                 0 ml  Net           250.07 ml   Filed Weights   03/30/17 1752 2017-04-17 0523 04/17/2017 0525  Weight: 150 lb (68 kg) 148 lb (67.1 kg) 148 lb 13 oz (67.5 kg)   Body mass index is 23.31 kg/m.  General: Well developed, well nourished, in no acute distress. Head: Normocephalic, atraumatic, Neck: Negative for carotid bruits. JVD not elevated. Lungs: Clear bilaterally to auscultation without wheezes, rales, or rhonchi. Breathing is unlabored. Heart: RRR with S1 S2. No murmurs, rubs, or gallops appreciated. Abdomen: Soft, non-tender, non-distended with normoactive bowel sounds. Msk:  Strength and tone appear normal for age. Extremities: No clubbing or cyanosis. No edema.  Neuro: Alert and oriented X 3. No facial asymmetry. Psych:  Responds to questions appropriately with a normal affect.  EKG:  The EKG was personally reviewed and demonstrates HR 108, sinus tachycardia   Relevant CV Studies: Cardiac Catheterization 04-17-17  Laboratory Data:  Chemistry Recent Labs Lab 03/30/17 1812  NA 139  K 3.1*  CL 103  CO2 20*  GLUCOSE 117*  BUN 10  CREATININE 0.63  CALCIUM 9.8  GFRNONAA >60  GFRAA >60  ANIONGAP 16*     Recent Labs Lab 03/30/17 1812  PROT 7.8  ALBUMIN 4.7  AST 25  ALT 20  ALKPHOS 82  BILITOT 0.6   Hematology Recent Labs Lab 03/30/17 1812  WBC 7.6  RBC 4.54  HGB 15.1*  HCT 43.5  MCV 95.8  MCH 33.3  MCHC 34.7  RDW 12.4  PLT 290   Cardiac Enzymes Recent Labs Lab 03/30/17 1837 04/17/2017 0103 04-17-17 0205  TROPONINI 0.04* 36.97* 32.53*    Recent Labs Lab 03/30/17 1836 17-Apr-2017 0314  TROPIPOC 0.01 23.45*    BNPNo results for input(s): BNP, PROBNP in the last 168 hours.  DDimer No results for input(s): DDIMER in the last 168 hours.  Radiology/Studies:  Dg Chest 2 View  Result Date: 03/30/2017 CLINICAL DATA:  Chest pain EXAM: CHEST  2 VIEW COMPARISON:  None. FINDINGS: The heart size and mediastinal contours are within normal limits. Both lungs  are clear. The visualized skeletal structures are unremarkable. IMPRESSION: No active cardiopulmonary disease. Electronically Signed   By: Marlan Palau M.D.   On: 03/30/2017 18:36   Ct Angio Chest/abd/pel For Dissection W And/or Wo Contrast  Result Date: 03/30/2017 CLINICAL DATA:  51 year old female with sharp pain in the chest. Evaluate for dissection. EXAM: CT ANGIOGRAPHY CHEST, ABDOMEN AND PELVIS TECHNIQUE: Multidetector CT imaging through the chest, abdomen  and pelvis was performed using the standard protocol during bolus administration of intravenous contrast. Multiplanar reconstructed images and MIPs were obtained and reviewed to evaluate the vascular anatomy. CONTRAST:  100 cc Isovue 370 COMPARISON:  Chest radiograph dated 03/30/2017 FINDINGS: CTA CHEST FINDINGS Cardiovascular: There is no cardiomegaly or pericardial effusion. The thoracic aorta is unremarkable. No aneurysmal dilatation or dissection. The origins of the great vessels of the aortic arch appear patent. No CT evidence of central pulmonary artery embolus. Mediastinum/Nodes: There is no hilar or mediastinal adenopathy. The esophagus and the thyroid gland are grossly unremarkable. No mediastinal fluid collection or hematoma. Lungs/Pleura: The lungs are clear. There is no pleural effusion or pneumothorax. The central airways are patent. Musculoskeletal: No chest wall abnormality. No acute or significant osseous findings. Review of the MIP images confirms the above findings. CTA ABDOMEN AND PELVIS FINDINGS VASCULAR Aorta: Normal caliber aorta without aneurysm, dissection, vasculitis or significant stenosis. Celiac: There is focal aneurysmal dilatation of the celiac axis measuring up to 9 mm in diameter approximately 17 mm from the origin. No dissection. The celiac axis is patent. There is an accessory left hepatic artery arising from the left gastric artery. An accessory right hepatic artery arising from the SMA. SMA: Patent without evidence of  aneurysm, dissection, vasculitis or significant stenosis. Renals: Both renal arteries are patent without evidence of aneurysm, dissection, vasculitis, fibromuscular dysplasia or significant stenosis. IMA: Patent without evidence of aneurysm, dissection, vasculitis or significant stenosis. Inflow: Patent without evidence of aneurysm, dissection, vasculitis or significant stenosis. Veins: No obvious venous abnormality within the limitations of this arterial phase study. Review of the MIP images confirms the above findings. NON-VASCULAR No intra-abdominal free air or free fluid. Hepatobiliary: No focal liver abnormality is seen. No gallstones, gallbladder wall thickening, or biliary dilatation. Pancreas: Unremarkable. No pancreatic ductal dilatation or surrounding inflammatory changes. Spleen: Normal in size without focal abnormality. Adrenals/Urinary Tract: Adrenal glands are unremarkable. Kidneys are normal, without renal calculi, focal lesion, or hydronephrosis. Bladder is unremarkable. Stomach/Bowel: Small sigmoid diverticula without active inflammatory changes. There is no evidence of bowel obstruction or active inflammation. Normal appendix. Lymphatic: No adenopathy. Reproductive: Ill-defined uterine fibroids. Ultrasound may provide better evaluation of the pelvic structures. The ovaries are grossly unremarkable. Other: None Musculoskeletal: Degenerative changes and sclerotic changes of the L4. Mild diffuse disc bulge at L4-5. No acute osseous pathology. Review of the MIP images confirms the above findings. IMPRESSION: 1. No acute intrathoracic, abdominal, or pelvic pathology. No aortic aneurysm or dissection. No CT evidence of central pulmonary artery embolus. 2. 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 3. Small scattered sigmoid diverticula. No active inflammation or bowel obstruction. Normal appendix. 4. Small uterine fibroids. Ultrasound may provide better  evaluation of the pelvic structures. Electronically Signed   By: Elgie CollardArash  Radparvar M.D.   On: 03/30/2017 23:45    Assessment and Plan:   1. Acute myocardial infarction: Troponin < 0.03 << 0.04 << 36.97 >> 32.53  Her pain was described as sharp and severe spasms in between her shoulder blades that was associated with difficulty obtaining a good breath. She is currently chest pain free and will go for cath this AM.  -- lipid panel ordered -- IV nitro drip and heparin ordered  The patient understands that risks included but are not limited to stroke (1 in 1000), death (1 in 1000), kidney failure [usually temporary] (1 in 500), bleeding (1 in 200), allergic reaction [possibly serious] (1 in  200).   2. Hypokalemia: 3.1 - replaced by hospitalist service.    SignedDorthula Matas, PA-C  03/31/2017 7:57 AM   I have examined the patient and reviewed assessment and plan and discussed with patient.  Agree with above as stated.  Patient with 30-45 minutes of severe back and chest pain. This resolved in the [facility] long emergency room. Now with significantly elevated troponin. I suspect that she had an occluded vessel which spontaneously recanalized. No bleeding problems in the past. No contraindication to drug-eluting stent or dual antiplatelet therapy. Plan for cardiac cath this morning. I discussed the procedure with the patient and all questions were answered.  Lance Muss

## 2017-03-31 NOTE — Interval H&P Note (Signed)
Cath Lab Visit (complete for each Cath Lab visit)  Clinical Evaluation Leading to the Procedure:   ACS: Yes.    Non-ACS:    Anginal Classification: CCS IV  Anti-ischemic medical therapy: No Therapy  Non-Invasive Test Results: No non-invasive testing performed  Prior CABG: No previous CABG      History and Physical Interval Note:  03/31/2017 9:52 AM  Vickki E Groome-Willetts  has presented today for surgery, with the diagnosis of positive trop  The various methods of treatment have been discussed with the patient and family. After consideration of risks, benefits and other options for treatment, the patient has consented to  Procedure(s): Left Heart Cath and Coronary Angiography (N/A) as a surgical intervention .  The patient's history has been reviewed, patient examined, no change in status, stable for surgery.  I have reviewed the patient's chart and labs.  Questions were answered to the patient's satisfaction.     Nicki Guadalajarahomas Kelly

## 2017-03-31 NOTE — H&P (Signed)
History and Physical    Selena Vega XLK:[id number] DOB: 03/10/1966 DOA: 03/30/2017  PCP: Patient, No Pcp Per   Patient coming from: Home.   I have personally briefly reviewed patient's old medical records in [facility]  Chief Complaint: "Lambert ModySharp back pain"  HPI: Selena Vega is a 51 y.o. female with no significant past medical history who is coming to the ER with complaints of sudden onset of sharp back pain radiates to her mid chest right after dinner yesterday evening associated with dyspnea, mild dizziness and nausea. She took 325 mg of aspirin at home and came to the emergency department. She denies diaphoresis, PND, orthopnea or pitting edema of the lower extremities. The pain was relieved substantially when the patient got one tablet of sublingual nitroglycerin in the emergency department. She denies exercise intolerance. She denies cigarette smoking, elevated cholesterol, elevated glucose. She denies fever, chills, sore throat, productive cough, abdominal pain, emesis, diarrhea, constipation, melena, hematochezia, dysuria, frequency or hematuria. She denies blurry vision, polyuria or polydipsia.  ED Course: The patient received sublingual nitroglycerin in the emergency department with substantial relief of pain. She was started on heparin infusion. EKGs did not show any ischemic changes, but her troponin levels trending from 0.04 to 37 on the second measurement. Her WBC is 7.6, hemoglobin 15.1 g/dL and platelets [id number]. Coagulation studies were normal. CMP showed potassium at 3.1 and chloride of 20 mmol/L. Her glucose was 1 17 mg/dL, but otherwise her chemistry tests were normal.  Imaging: Her chest radiograph did not show any acute pathology. 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 Please see images and full report for further detail.  Review  of Systems: As per HPI otherwise 10 point review of systems negative.    History reviewed. No pertinent past medical history.  Past Surgical History:  Procedure Laterality Date  . CESAREAN SECTION       reports that she has never smoked. She has never used smokeless tobacco. She reports that she drinks alcohol. She reports that she does not use drugs.  Allergies  Allergen Reactions  . Sulfa Antibiotics Hives    Family History  Problem Relation Age of Onset  . Liver cancer Mother   . Heart disease Sister   . Hypothyroidism Sister     Prior to Admission medications   Medication Sig Start Date End Date Taking? Authorizing Provider  tetrahydrozoline-zinc (VISINE-AC) 0.05-0.25 % ophthalmic solution Place 2 drops into both eyes 3 (three) times daily as needed.   Yes [provider]  fluconazole (DIFLUCAN) 150 MG tablet Take 150 mg by mouth once.    [provider]  ibuprofen (ADVIL,MOTRIN) 600 MG tablet Take 600 mg by mouth every 8 (eight) hours as needed for pain.    [provider]  Ivermectin (SKLICE) 0.5 % LOTN Sklice 0.5 % lotion    [provider]  nitrofurantoin, macrocrystal-monohydrate, (MACROBID) 100 MG capsule Take 100 mg by mouth 2 (two) times daily.    [provider]  phenazopyridine (PYRIDIUM) 100 MG tablet Take 100 mg by mouth 2 (two) times daily as needed.    [provider]    Physical Exam: Vitals:   03/31/17 0039 03/31/17 0121 03/31/17 0211 03/31/17 0244  BP: (!) 131/93 (!) 131/93 (!) 130/94 (!) 135/97  Pulse: 77 76 (!) 103 89  Resp: (!) 21 14 20 16   Temp:  TempSrc:      SpO2: 99% 100% 96% 99%  Weight:      Height:        Constitutional: NAD, calm, comfortable Eyes: PERRL, lids and conjunctivae normal ENMT: Mucous membranes are moist. Posterior pharynx clear of any exudate or lesions. Neck: normal, supple, no masses, no thyromegaly Respiratory: clear to auscultation bilaterally, no wheezing, no  crackles. Normal respiratory effort. No accessory muscle use.  Cardiovascular: Regular rate and rhythm, no murmurs / rubs / gallops. No extremity edema. 2+ pedal pulses. No carotid bruits.  Abdomen: no tenderness, no masses palpated. No hepatosplenomegaly. Bowel sounds positive.  Musculoskeletal: no clubbing / cyanosis. No joint deformity upper and lower extremities. Good ROM, no contractures. Normal muscle tone.  Skin: no rashes, lesions, ulcers on limited.  Neurologic: CN 2-12 grossly intact. Sensation intact, DTR normal. Strength 5/5 in all 4.  Psychiatric: Normal judgment and insight. Alert and oriented x 3. Normal mood.    Labs on Admission: I have personally reviewed following labs and imaging studies  CBC:  Recent Labs Lab 03/30/17 1812  WBC 7.6  NEUTROABS 3.5  HGB 15.1*  HCT 43.5  MCV 95.8  PLT 290   Basic Metabolic Panel:  Recent Labs Lab 03/30/17 1812 03/31/17 0103  NA 139  --   K 3.1*  --   CL 103  --   CO2 20*  --   GLUCOSE 117*  --   BUN 10  --   CREATININE 0.63  --   CALCIUM 9.8  --   MG  --  2.0   GFR: Estimated Creatinine Clearance: 80 mL/min (by C-G formula based on SCr of 0.63 mg/dL). Liver Function Tests:  Recent Labs Lab 03/30/17 1812  AST 25  ALT 20  ALKPHOS 82  BILITOT 0.6  PROT 7.8  ALBUMIN 4.7    Recent Labs Lab 03/30/17 1812  LIPASE 30   No results for input(s): AMMONIA in the last 168 hours. Coagulation Profile:  Recent Labs Lab 03/31/17 0005  INR 1.02   Cardiac Enzymes:  Recent Labs Lab 03/30/17 1837 03/31/17 0103  TROPONINI 0.04* 36.97*   BNP (last 3 results) No results for input(s): PROBNP in the last 8760 hours. HbA1C: No results for input(s): HGBA1C in the last 72 hours. CBG: No results for input(s): GLUCAP in the last 168 hours. Lipid Profile: No results for input(s): CHOL, HDL, LDLCALC, TRIG, CHOLHDL, LDLDIRECT in the last 72 hours. Thyroid Function Tests: No results for input(s): TSH, T4TOTAL,  FREET4, T3FREE, THYROIDAB in the last 72 hours. Anemia Panel: No results for input(s): VITAMINB12, FOLATE, FERRITIN, TIBC, IRON, RETICCTPCT in the last 72 hours. Urine analysis: No results found for: COLORURINE, APPEARANCEUR, LABSPEC, PHURINE, GLUCOSEU, HGBUR, BILIRUBINUR, KETONESUR, PROTEINUR, UROBILINOGEN, NITRITE, LEUKOCYTESUR  Radiological Exams on Admission: Dg Chest 2 View  Result Date: 03/30/2017 CLINICAL DATA:  Chest pain EXAM: CHEST  2 VIEW COMPARISON:  None. FINDINGS: The heart size and mediastinal contours are within normal limits. Both lungs are clear. The visualized skeletal structures are unremarkable. IMPRESSION: No active cardiopulmonary disease. Electronically Signed   By: Marlan Palauharles  Clark M.D.   On: 03/30/2017 18:36   Ct Angio Chest/abd/pel For Dissection W And/or Wo Contrast  Result Date: 03/30/2017 CLINICAL DATA:  51 year old female with sharp pain in the chest. Evaluate for dissection. EXAM: CT ANGIOGRAPHY CHEST, ABDOMEN AND PELVIS TECHNIQUE: Multidetector CT imaging through the chest, abdomen and pelvis was performed using the standard protocol during bolus administration of intravenous contrast. Multiplanar reconstructed images and  MIPs were obtained and reviewed to evaluate the vascular anatomy. CONTRAST:  100 cc Isovue 370 COMPARISON:  Chest radiograph dated 03/30/2017 FINDINGS: CTA CHEST FINDINGS Cardiovascular: There is no cardiomegaly or pericardial effusion. The thoracic aorta is unremarkable. No aneurysmal dilatation or dissection. The origins of the great vessels of the aortic arch appear patent. No CT evidence of central pulmonary artery embolus. Mediastinum/Nodes: There is no hilar or mediastinal adenopathy. The esophagus and the thyroid gland are grossly unremarkable. No mediastinal fluid collection or hematoma. Lungs/Pleura: The lungs are clear. There is no pleural effusion or pneumothorax. The central airways are patent. Musculoskeletal: No chest wall abnormality. No  acute or significant osseous findings. Review of the MIP images confirms the above findings. CTA ABDOMEN AND PELVIS FINDINGS VASCULAR Aorta: Normal caliber aorta without aneurysm, dissection, vasculitis or significant stenosis. Celiac: There is focal aneurysmal dilatation of the celiac axis measuring up to 9 mm in diameter approximately 17 mm from the origin. No dissection. The celiac axis is patent. There is an accessory left hepatic artery arising from the left gastric artery. An accessory right hepatic artery arising from the SMA. SMA: Patent without evidence of aneurysm, dissection, vasculitis or significant stenosis. Renals: Both renal arteries are patent without evidence of aneurysm, dissection, vasculitis, fibromuscular dysplasia or significant stenosis. IMA: Patent without evidence of aneurysm, dissection, vasculitis or significant stenosis. Inflow: Patent without evidence of aneurysm, dissection, vasculitis or significant stenosis. Veins: No obvious venous abnormality within the limitations of this arterial phase study. Review of the MIP images confirms the above findings. NON-VASCULAR No intra-abdominal free air or free fluid. Hepatobiliary: No focal liver abnormality is seen. No gallstones, gallbladder wall thickening, or biliary dilatation. Pancreas: Unremarkable. No pancreatic ductal dilatation or surrounding inflammatory changes. Spleen: Normal in size without focal abnormality. Adrenals/Urinary Tract: Adrenal glands are unremarkable. Kidneys are normal, without renal calculi, focal lesion, or hydronephrosis. Bladder is unremarkable. Stomach/Bowel: Small sigmoid diverticula without active inflammatory changes. There is no evidence of bowel obstruction or active inflammation. Normal appendix. Lymphatic: No adenopathy. Reproductive: Ill-defined uterine fibroids. Ultrasound may provide better evaluation of the pelvic structures. The ovaries are grossly unremarkable. Other: None Musculoskeletal:  Degenerative changes and sclerotic changes of the L4. Mild diffuse disc bulge at L4-5. No acute osseous pathology. Review of the MIP images confirms the above findings. IMPRESSION: 1. No acute intrathoracic, abdominal, or pelvic pathology. No aortic aneurysm or dissection. No CT evidence of central pulmonary artery embolus. 2. 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 3. Small scattered sigmoid diverticula. No active inflammation or bowel obstruction. Normal appendix. 4. Small uterine fibroids. Ultrasound may provide better evaluation of the pelvic structures. Electronically Signed   By: Elgie Collard M.D.   On: 03/30/2017 23:45    EKG: Independently reviewed. Vent. rate 93 BPM PR interval * ms QRS duration 83 ms QT/QTc 362/451 ms P-R-T axes 45 54 59 Sinus rhythm Low voltage, extremity and precordial leads  Assessment/Plan Principal Problem:   Acute myocardial infarction [facility])   Chest pain Transferred to [facility] Discussed with cardiology earlier and notified of arrival to [facility] They will be seeing the patient for further evaluation and treatment. Continue heparin infusion. Metoprolol 2.5 mg IVP given earlier due to mild tachycardia. Optimize electrolytes.  Active Problems:   Hypokalemia Replaced. Magnesium level has been optimize.    Elevated troponin As above.    DVT prophylaxis: On heparin infusion. Code Status: Full code. Family Communication:  Disposition Plan:  Transfer to [facility] for cardiology evaluation. Consults called: Cardiology (Dr. Allena Katz) Admission status: Inpatient/Stepdown.   Bobette Mo MD [facility] Pager [phone]  If 7PM-7AM, please contact night-coverage [email or web address] Password [email or web address]  03/31/2017, 2:48 AM

## 2017-03-31 NOTE — Progress Notes (Signed)
ANTICOAGULATION CONSULT NOTE  Pharmacy Consult for IV heparin Indication: chest pain/ACS  Allergies  Allergen Reactions  . Sulfa Antibiotics Hives    Patient Measurements: Height: 5\' 7"  (170.2 cm) Weight: 150 lb (68 kg) IBW/kg (Calculated) : 61.6 Heparin Dosing Weight: TBW  Vital Signs: Temp: 97.7 F (36.5 C) (07/31 1752) Temp Source: Oral (07/31 1752) BP: 141/96 (08/01 0002) Pulse Rate: 74 (08/01 0002)  Labs:  Recent Labs  03/30/17 1812 03/30/17 1837  HGB 15.1*  --   HCT 43.5  --   PLT 290  --   CREATININE 0.63  --   TROPONINI  --  0.04*    Estimated Creatinine Clearance: 80 mL/min (by C-G formula based on SCr of 0.63 mg/dL).   Medical History: History reviewed. No pertinent past medical history.  Medications:   (Not in a hospital admission) Scheduled:  . heparin  4,000 Units Intravenous Once   Assessment: [age] yoF presenting with chest/back pain and nausea. Trop slightly elevated. Pharmacy to dose heparin to r/o ACS.    Baseline INR, aPTT: pending  Prior anticoagulation: none  Significant events:  Today, 03/31/2017:  CBC: wnl  No bleeding or infusion issues per nursing  CrCl: 80 ml/min  Goal of Therapy: Heparin level 0.3-0.7 units/ml Monitor platelets by anticoagulation protocol: Yes  Plan:  Heparin 4000 units IV bolus x 1  Heparin 800 units/hr IV infusion  Check heparin level 6 hrs after start  Daily CBC, daily heparin level once stable  Monitor for signs of bleeding or thrombosis   Bernadene Personrew Thresia Ramanathan, PharmD Pager: [phone] 03/31/2017, 12:08 AM

## 2017-03-31 NOTE — ED Notes (Signed)
Date and time results received: 03/31/17 0250  Test:Troponin Critical Value: 32.53 Name of Provider Notified: Dr. Robb Matarrtiz Orders Received? Or Actions Taken?:Cardiology consult

## 2017-03-31 NOTE — H&P (View-Only) (Signed)
Cardiology Consultation:   Patient ID: Selena Vega; [id number]; 09/11/1965   Admit date: 03/30/2017 Date of Consult: 03/31/2017  Primary Care Provider: Patient, No Pcp Per Primary Cardiologist: New Primary Electrophysiologist:  None   Patient Profile:   Selena Vega is a 51 y.o. female with a hx of no medical problems.  The patient is being seen today for the evaluation of NSTEMI at the request of Dr. Susie CassetteAbrol.  History of Present Illness:   Selena Vega is an over all very healthy female who works in the [employer] and drinks 4-5 glasses of wine a day. She reports having a good healthy diet and no family history of cardiac disease. After eating dinner last night she sat down at the computer to do some work and developed  very severe chest pain between her shoulder blades around 6pm, it was described as a spasm and a very sharp pain. She does have hx of shoulder problems and muscle spasms but this felt different. She told her husband she was hurting and he gave her an aspirin, her pain became unbearable and started to wrap around to her chest therefore she told him to take her to the emergency department. In the ER her initial Troponin was negative. The second one was slightly elevated at 0.04 and then the third one resulted at 36.97. She did have a dissection CT study that was negative for dissection or PE. She was started on Heparin and Nitro and [facility]. She is now pain free, her 4th troponin has trended down to 23.45. She is well appearing,no distress at this time, pain free and hemodynamically stable.  EKG : HR 93, no ischemic changes.  Hemoglobin 15.1 Creatinine 0.63 Potassium 3.1, replaced by hospitalist   Blood pressure (!) 129/99, pulse 80, temperature 98.4 F (36.9 C), temperature source Oral, resp. rate 18, height 5\' 7"  (1.702 m), weight 148 lb 13 oz (67.5 kg), SpO2 100 %.   History reviewed. No pertinent  past medical history.  Past Surgical History:  Procedure Laterality Date  . CESAREAN SECTION       Inpatient Medications: Scheduled Meds: . [START ON 04/01/2017] aspirin  81 mg Oral Pre-Cath  . sodium chloride flush  3 mL Intravenous Q12H   Continuous Infusions: . sodium chloride    . 0.9 % NaCl with KCl 20 mEq / L 100 mL/hr at 03/31/17 0353  . [START ON 04/01/2017] sodium chloride     Followed by  . [START ON 04/01/2017] sodium chloride    . heparin 800 Units/hr (03/31/17 0042)   PRN Meds: sodium chloride, acetaminophen, morphine injection, nitroGLYCERIN, ondansetron (ZOFRAN) IV, sodium chloride flush  Allergies:    Allergies  Allergen Reactions  . Sulfa Antibiotics Hives    Social History:   Social History   Social History  . Marital status: Married    Spouse name: N/A  . Number of children: N/A  . Years of education: N/A   Occupational History  . Not on file.   Social History Main Topics  . Smoking status: Never Smoker  . Smokeless tobacco: Never Used  . Alcohol use Yes     Comment: wine daily  . Drug use: No  . Sexual activity: Not on file   Other Topics Concern  . Not on file   Social History Narrative  . No narrative on file      Family History: The patient's family history includes Heart disease in her sister; Hypothyroidism  in her sister; Liver cancer in her mother.  ROS:  Please see the history of present illness.  All other ROS reviewed and negative.     Physical Exam/Data:   Vitals:   04/17/17 0432 04/17/2017 0523 04/17/17 0525 17-Apr-2017 0701  BP: (!) 114/94  123/86 (!) 129/99  Pulse: 67  81 80  Resp: 17  11 18   Temp:  97.8 F (36.6 C)  98.4 F (36.9 C)  TempSrc:  Oral  Oral  SpO2: 96%  99% 100%  Weight:  148 lb (67.1 kg) 148 lb 13 oz (67.5 kg)   Height:   5\' 7"  (1.702 m)     Intake/Output Summary (Last 24 hours) at 2017/04/17 0757 Last data filed at 2017-04-17 0530  Gross per 24 hour  Intake           250.07 ml  Output                 0 ml  Net           250.07 ml   Filed Weights   03/30/17 1752 2017-04-17 0523 04/17/2017 0525  Weight: 150 lb (68 kg) 148 lb (67.1 kg) 148 lb 13 oz (67.5 kg)   Body mass index is 23.31 kg/m.  General: Well developed, well nourished, in no acute distress. Head: Normocephalic, atraumatic, Neck: Negative for carotid bruits. JVD not elevated. Lungs: Clear bilaterally to auscultation without wheezes, rales, or rhonchi. Breathing is unlabored. Heart: RRR with S1 S2. No murmurs, rubs, or gallops appreciated. Abdomen: Soft, non-tender, non-distended with normoactive bowel sounds. Msk:  Strength and tone appear normal for age. Extremities: No clubbing or cyanosis. No edema.  Neuro: Alert and oriented X 3. No facial asymmetry. Psych:  Responds to questions appropriately with a normal affect.  EKG:  The EKG was personally reviewed and demonstrates HR 108, sinus tachycardia   Relevant CV Studies: Cardiac Catheterization 04-17-17  Laboratory Data:  Chemistry Recent Labs Lab 03/30/17 1812  NA 139  K 3.1*  CL 103  CO2 20*  GLUCOSE 117*  BUN 10  CREATININE 0.63  CALCIUM 9.8  GFRNONAA >60  GFRAA >60  ANIONGAP 16*     Recent Labs Lab 03/30/17 1812  PROT 7.8  ALBUMIN 4.7  AST 25  ALT 20  ALKPHOS 82  BILITOT 0.6   Hematology Recent Labs Lab 03/30/17 1812  WBC 7.6  RBC 4.54  HGB 15.1*  HCT 43.5  MCV 95.8  MCH 33.3  MCHC 34.7  RDW 12.4  PLT 290   Cardiac Enzymes Recent Labs Lab 03/30/17 1837 04/17/2017 0103 04-17-17 0205  TROPONINI 0.04* 36.97* 32.53*    Recent Labs Lab 03/30/17 1836 17-Apr-2017 0314  TROPIPOC 0.01 23.45*    BNPNo results for input(s): BNP, PROBNP in the last 168 hours.  DDimer No results for input(s): DDIMER in the last 168 hours.  Radiology/Studies:  Dg Chest 2 View  Result Date: 03/30/2017 CLINICAL DATA:  Chest pain EXAM: CHEST  2 VIEW COMPARISON:  None. FINDINGS: The heart size and mediastinal contours are within normal limits. Both lungs  are clear. The visualized skeletal structures are unremarkable. IMPRESSION: No active cardiopulmonary disease. Electronically Signed   By: Marlan Palau M.D.   On: 03/30/2017 18:36   Ct Angio Chest/abd/pel For Dissection W And/or Wo Contrast  Result Date: 03/30/2017 CLINICAL DATA:  51 year old female with sharp pain in the chest. Evaluate for dissection. EXAM: CT ANGIOGRAPHY CHEST, ABDOMEN AND PELVIS TECHNIQUE: Multidetector CT imaging through the chest, abdomen  and pelvis was performed using the standard protocol during bolus administration of intravenous contrast. Multiplanar reconstructed images and MIPs were obtained and reviewed to evaluate the vascular anatomy. CONTRAST:  100 cc Isovue 370 COMPARISON:  Chest radiograph dated 03/30/2017 FINDINGS: CTA CHEST FINDINGS Cardiovascular: There is no cardiomegaly or pericardial effusion. The thoracic aorta is unremarkable. No aneurysmal dilatation or dissection. The origins of the great vessels of the aortic arch appear patent. No CT evidence of central pulmonary artery embolus. Mediastinum/Nodes: There is no hilar or mediastinal adenopathy. The esophagus and the thyroid gland are grossly unremarkable. No mediastinal fluid collection or hematoma. Lungs/Pleura: The lungs are clear. There is no pleural effusion or pneumothorax. The central airways are patent. Musculoskeletal: No chest wall abnormality. No acute or significant osseous findings. Review of the MIP images confirms the above findings. CTA ABDOMEN AND PELVIS FINDINGS VASCULAR Aorta: Normal caliber aorta without aneurysm, dissection, vasculitis or significant stenosis. Celiac: There is focal aneurysmal dilatation of the celiac axis measuring up to 9 mm in diameter approximately 17 mm from the origin. No dissection. The celiac axis is patent. There is an accessory left hepatic artery arising from the left gastric artery. An accessory right hepatic artery arising from the SMA. SMA: Patent without evidence of  aneurysm, dissection, vasculitis or significant stenosis. Renals: Both renal arteries are patent without evidence of aneurysm, dissection, vasculitis, fibromuscular dysplasia or significant stenosis. IMA: Patent without evidence of aneurysm, dissection, vasculitis or significant stenosis. Inflow: Patent without evidence of aneurysm, dissection, vasculitis or significant stenosis. Veins: No obvious venous abnormality within the limitations of this arterial phase study. Review of the MIP images confirms the above findings. NON-VASCULAR No intra-abdominal free air or free fluid. Hepatobiliary: No focal liver abnormality is seen. No gallstones, gallbladder wall thickening, or biliary dilatation. Pancreas: Unremarkable. No pancreatic ductal dilatation or surrounding inflammatory changes. Spleen: Normal in size without focal abnormality. Adrenals/Urinary Tract: Adrenal glands are unremarkable. Kidneys are normal, without renal calculi, focal lesion, or hydronephrosis. Bladder is unremarkable. Stomach/Bowel: Small sigmoid diverticula without active inflammatory changes. There is no evidence of bowel obstruction or active inflammation. Normal appendix. Lymphatic: No adenopathy. Reproductive: Ill-defined uterine fibroids. Ultrasound may provide better evaluation of the pelvic structures. The ovaries are grossly unremarkable. Other: None Musculoskeletal: Degenerative changes and sclerotic changes of the L4. Mild diffuse disc bulge at L4-5. No acute osseous pathology. Review of the MIP images confirms the above findings. IMPRESSION: 1. No acute intrathoracic, abdominal, or pelvic pathology. No aortic aneurysm or dissection. No CT evidence of central pulmonary artery embolus. 2. 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 3. Small scattered sigmoid diverticula. No active inflammation or bowel obstruction. Normal appendix. 4. Small uterine fibroids. Ultrasound may provide better  evaluation of the pelvic structures. Electronically Signed   By: Elgie CollardArash  Radparvar M.D.   On: 03/30/2017 23:45    Assessment and Plan:   1. Acute myocardial infarction: Troponin < 0.03 << 0.04 << 36.97 >> 32.53  Her pain was described as sharp and severe spasms in between her shoulder blades that was associated with difficulty obtaining a good breath. She is currently chest pain free and will go for cath this AM.  -- lipid panel ordered -- IV nitro drip and heparin ordered  The patient understands that risks included but are not limited to stroke (1 in 1000), death (1 in 1000), kidney failure [usually temporary] (1 in 500), bleeding (1 in 200), allergic reaction [possibly serious] (1 in  200).   2. Hypokalemia: 3.1 - replaced by hospitalist service.    SignedDorthula Matas, PA-C  03/31/2017 7:57 AM   I have examined the patient and reviewed assessment and plan and discussed with patient.  Agree with above as stated.  Patient with 30-45 minutes of severe back and chest pain. This resolved in the [facility] long emergency room. Now with significantly elevated troponin. I suspect that she had an occluded vessel which spontaneously recanalized. No bleeding problems in the past. No contraindication to drug-eluting stent or dual antiplatelet therapy. Plan for cardiac cath this morning. I discussed the procedure with the patient and all questions were answered.  Lance Muss

## 2017-04-01 ENCOUNTER — Encounter (HOSPITAL_COMMUNITY): Payer: Self-pay | Admitting: Cardiovascular Disease

## 2017-04-01 DIAGNOSIS — I519 Heart disease, unspecified: Secondary | ICD-10-CM

## 2017-04-01 DIAGNOSIS — R748 Abnormal levels of other serum enzymes: Secondary | ICD-10-CM

## 2017-04-01 LAB — HIV ANTIBODY (ROUTINE TESTING W REFLEX): HIV Screen 4th Generation wRfx: NONREACTIVE

## 2017-04-01 LAB — CBC WITH DIFFERENTIAL/PLATELET
BASOS ABS: 0 10*3/uL (ref 0.0–0.1)
Basophils Relative: 0 %
EOS PCT: 3 %
Eosinophils Absolute: 0.1 10*3/uL (ref 0.0–0.7)
HEMATOCRIT: 39.8 % (ref 36.0–46.0)
Hemoglobin: 13.2 g/dL (ref 12.0–15.0)
LYMPHS PCT: 31 %
Lymphs Abs: 1.6 10*3/uL (ref 0.7–4.0)
MCH: 32.2 pg (ref 26.0–34.0)
MCHC: 33.2 g/dL (ref 30.0–36.0)
MCV: 97.1 fL (ref 78.0–100.0)
MONO ABS: 0.5 10*3/uL (ref 0.1–1.0)
MONOS PCT: 10 %
NEUTROS ABS: 2.9 10*3/uL (ref 1.7–7.7)
Neutrophils Relative %: 56 %
PLATELETS: 220 10*3/uL (ref 150–400)
RBC: 4.1 MIL/uL (ref 3.87–5.11)
RDW: 12.8 % (ref 11.5–15.5)
WBC: 5.2 10*3/uL (ref 4.0–10.5)

## 2017-04-01 LAB — COMPREHENSIVE METABOLIC PANEL
ALBUMIN: 3.5 g/dL (ref 3.5–5.0)
ALK PHOS: 57 U/L (ref 38–126)
ALT: 20 U/L (ref 14–54)
AST: 56 U/L — AB (ref 15–41)
Anion gap: 7 (ref 5–15)
BILIRUBIN TOTAL: 0.9 mg/dL (ref 0.3–1.2)
CO2: 23 mmol/L (ref 22–32)
Calcium: 8.7 mg/dL — ABNORMAL LOW (ref 8.9–10.3)
Chloride: 111 mmol/L (ref 101–111)
Creatinine, Ser: 0.51 mg/dL (ref 0.44–1.00)
GFR calc Af Amer: >60 mL/min (ref >60)
GFR calc non Af Amer: >60 mL/min (ref >60)
GLUCOSE: 109 mg/dL — AB (ref 65–99)
POTASSIUM: 3.6 mmol/L (ref 3.5–5.1)
SODIUM: 141 mmol/L (ref 135–145)
TOTAL PROTEIN: 6 g/dL — AB (ref 6.5–8.1)

## 2017-04-01 LAB — TROPONIN I: TROPONIN I: 13.18 ng/mL — AB (ref <0.03)

## 2017-04-01 MED ORDER — ASPIRIN 81 MG PO CHEW: 81.0000 mg | CHEWABLE_TABLET | Freq: Every day | ORAL | 1 refills | Status: DC

## 2017-04-01 MED ORDER — METOPROLOL TARTRATE 12.5 MG HALF TABLET
12.5000 mg | ORAL_TABLET | Freq: Two times a day (BID) | ORAL | Status: DC
  Administered 2017-04-01 – 2017-04-02 (×3): 12.5 mg via ORAL
  Filled 2017-04-01 (×3): qty 1

## 2017-04-01 MED ORDER — AMLODIPINE BESYLATE 5 MG PO TABS: 5.0000 mg | ORAL_TABLET | Freq: Every day | ORAL | 1 refills | Status: DC

## 2017-04-01 MED ORDER — CLOPIDOGREL BISULFATE 75 MG PO TABS: 75.0000 mg | ORAL_TABLET | Freq: Every day | ORAL | 1 refills | Status: DC

## 2017-04-01 MED ORDER — ATORVASTATIN CALCIUM 80 MG PO TABS: 80.0000 mg | ORAL_TABLET | Freq: Every day | ORAL | 1 refills | Status: DC

## 2017-04-01 MED ORDER — OMEPRAZOLE 40 MG PO CPDR: 40.0000 mg | DELAYED_RELEASE_CAPSULE | Freq: Every day | ORAL | 1 refills | Status: DC

## 2017-04-01 MED ORDER — METOPROLOL TARTRATE 25 MG PO TABS: 12.5000 mg | ORAL_TABLET | Freq: Two times a day (BID) | ORAL | 1 refills | Status: DC

## 2017-04-01 NOTE — Discharge Summary (Signed)
Physician Discharge Summary  Selena Vega MRN: [id number] DOB/AGE: 1966-05-15 51 y.o.  PCP: Patient, No Pcp Per   Admit date: 03/30/2017 Discharge date: 04/01/2017  Discharge Diagnoses:    Principal Problem:   NSTEMI (non-ST elevated myocardial infarction) [facility]) Active Problems:   Chest pain   Hypokalemia   Elevated troponin  Addendum Patient would need to stay 1 more day as per cardiology Dr.Varanasi,  Follow-up recommendations Follow-up with PCP in 3-5 days , including all  additional recommended appointments as below Follow-up CBC, CMP in 3-5 days Follow-up with cardiology as scheduled      Current Discharge Medication List    START taking these medications   Details  amLODipine (NORVASC) 5 MG tablet Take 1 tablet (5 mg total) by mouth daily. Qty: 30 tablet, Refills: 1    aspirin 81 MG chewable tablet Chew 1 tablet (81 mg total) by mouth daily. Qty: 30 tablet, Refills: 1    atorvastatin (LIPITOR) 80 MG tablet Take 1 tablet (80 mg total) by mouth daily at 6 PM. Qty: 30 tablet, Refills: 1    clopidogrel (PLAVIX) 75 MG tablet Take 1 tablet (75 mg total) by mouth daily with breakfast. Qty: 30 tablet, Refills: 1    omeprazole (PRILOSEC) 40 MG capsule Take 1 capsule (40 mg total) by mouth daily. Qty: 30 capsule, Refills: 1      CONTINUE these medications which have NOT CHANGED   Details  tetrahydrozoline-zinc (VISINE-AC) 0.05-0.25 % ophthalmic solution Place 2 drops into both eyes 3 (three) times daily as needed.    fluconazole (DIFLUCAN) 150 MG tablet Take 150 mg by mouth once.    nitrofurantoin, macrocrystal-monohydrate, (MACROBID) 100 MG capsule Take 100 mg by mouth 2 (two) times daily.      STOP taking these medications     ibuprofen (ADVIL,MOTRIN) 600 MG tablet      Ivermectin (SKLICE) 0.5 % LOTN      phenazopyridine (PYRIDIUM) 100 MG tablet          Discharge Condition:Stable Discharge Instructions Get Medicines reviewed and  adjusted: Please take all your medications with you for your next visit with your Primary MD  Please request your Primary MD to go over all hospital tests and procedure/radiological results at the follow up, please ask your Primary MD to get all Hospital records sent to his/her office.  If you experience worsening of your admission symptoms, develop shortness of breath, life threatening emergency, suicidal or homicidal thoughts you must seek medical attention immediately by calling 911 or calling your MD immediately if symptoms less severe.  You must read complete instructions/literature along with all the possible adverse reactions/side effects for all the Medicines you take and that have been prescribed to you. Take any new Medicines after you have completely understood and accpet all the possible adverse reactions/side effects.   Do not drive when taking Pain medications.   Do not take more than prescribed Pain, Sleep and Anxiety Medications  Special Instructions: If you have smoked or chewed Tobacco in the last 2 yrs please stop smoking, stop any regular Alcohol and or any Recreational drug use.  Wear Seat belts while driving.  Please note  You were cared for by a hospitalist during your hospital stay. Once you are discharged, your primary care physician will handle any further medical issues. Please note that NO REFILLS for any discharge medications will be authorized once you are discharged, as it is imperative that you return to your primary care physician (or establish a  relationship with a primary care physician if you do not have one) for your aftercare needs so that they can reassess your need for medications and monitor your lab values.     Allergies  Allergen Reactions  . Sulfa Antibiotics Hives      Disposition:  home   Consults: * Cardiology    Significant Diagnostic Studies:  Dg Chest 2 View  Result Date: 03/30/2017 CLINICAL DATA:  Chest pain EXAM: CHEST   2 VIEW COMPARISON:  None. FINDINGS: The heart size and mediastinal contours are within normal limits. Both lungs are clear. The visualized skeletal structures are unremarkable. IMPRESSION: No active cardiopulmonary disease. Electronically Signed   By: Franchot Gallo M.D.   On: 03/30/2017 18:36   Ct Angio Chest/abd/pel For Dissection W And/or Wo Contrast  Result Date: 03/30/2017 CLINICAL DATA:  51 year old female with sharp pain in the chest. Evaluate for dissection. EXAM: CT ANGIOGRAPHY CHEST, ABDOMEN AND PELVIS TECHNIQUE: Multidetector CT imaging through the chest, abdomen and pelvis was performed using the standard protocol during bolus administration of intravenous contrast. Multiplanar reconstructed images and MIPs were obtained and reviewed to evaluate the vascular anatomy. CONTRAST:  100 cc Isovue 370 COMPARISON:  Chest radiograph dated 03/30/2017 FINDINGS: CTA CHEST FINDINGS Cardiovascular: There is no cardiomegaly or pericardial effusion. The thoracic aorta is unremarkable. No aneurysmal dilatation or dissection. The origins of the great vessels of the aortic arch appear patent. No CT evidence of central pulmonary artery embolus. Mediastinum/Nodes: There is no hilar or mediastinal adenopathy. The esophagus and the thyroid gland are grossly unremarkable. No mediastinal fluid collection or hematoma. Lungs/Pleura: The lungs are clear. There is no pleural effusion or pneumothorax. The central airways are patent. Musculoskeletal: No chest wall abnormality. No acute or significant osseous findings. Review of the MIP images confirms the above findings. CTA ABDOMEN AND PELVIS FINDINGS VASCULAR Aorta: Normal caliber aorta without aneurysm, dissection, vasculitis or significant stenosis. Celiac: There is focal aneurysmal dilatation of the celiac axis measuring up to 9 mm in diameter approximately 17 mm from the origin. No dissection. The celiac axis is patent. There is an accessory left hepatic artery arising from  the left gastric artery. An accessory right hepatic artery arising from the SMA. SMA: Patent without evidence of aneurysm, dissection, vasculitis or significant stenosis. Renals: Both renal arteries are patent without evidence of aneurysm, dissection, vasculitis, fibromuscular dysplasia or significant stenosis. IMA: Patent without evidence of aneurysm, dissection, vasculitis or significant stenosis. Inflow: Patent without evidence of aneurysm, dissection, vasculitis or significant stenosis. Veins: No obvious venous abnormality within the limitations of this arterial phase study. Review of the MIP images confirms the above findings. NON-VASCULAR No intra-abdominal free air or free fluid. Hepatobiliary: No focal liver abnormality is seen. No gallstones, gallbladder wall thickening, or biliary dilatation. Pancreas: Unremarkable. No pancreatic ductal dilatation or surrounding inflammatory changes. Spleen: Normal in size without focal abnormality. Adrenals/Urinary Tract: Adrenal glands are unremarkable. Kidneys are normal, without renal calculi, focal lesion, or hydronephrosis. Bladder is unremarkable. Stomach/Bowel: Small sigmoid diverticula without active inflammatory changes. There is no evidence of bowel obstruction or active inflammation. Normal appendix. Lymphatic: No adenopathy. Reproductive: Ill-defined uterine fibroids. Ultrasound may provide better evaluation of the pelvic structures. The ovaries are grossly unremarkable. Other: None Musculoskeletal: Degenerative changes and sclerotic changes of the L4. Mild diffuse disc bulge at L4-5. No acute osseous pathology. Review of the MIP images confirms the above findings. IMPRESSION: 1. No acute intrathoracic, abdominal, or pelvic pathology. No aortic aneurysm or dissection. No CT evidence  of central pulmonary artery embolus. 2. 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 3. Small scattered sigmoid diverticula. No active  inflammation or bowel obstruction. Normal appendix. 4. Small uterine fibroids. Ultrasound may provide better evaluation of the pelvic structures. Electronically Signed   By: Anner Crete M.D.   On: 03/30/2017 23:45     Left Heart Cath and Coronary Angiography   Mid LAD lesion, 40 %stenosed.  The left ventricular ejection fraction is 45-50% by visual estimate.  LV end diastolic pressure is mildly elevated.   Mild acute LV dysfunction with hypokinesis involving the mid distal anterolateral wall and apex.  Single vessel nonobstructive CAD with a sharp angle in the mid LAD segment, which may be a site of potential transient occlusion resulting from vasospasm.  There does not appear to be an angiographic spontaneous dissection, but the LAD distally is of smaller caliber than proximally.  No false lumen is visualized.  There was slight improvement following intracoronary nitroglycerin, but there still remained a residual 30-40% narrowing immediately beyond the sharp bend in the vessel.        Filed Weights   03/30/17 1752 03/31/17 0523 03/31/17 0525  Weight: 68 kg (150 lb) 67.1 kg (148 lb) 67.5 kg (148 lb 13 oz)     Microbiology: Recent Results (from the past 240 hour(s))  MRSA PCR Screening     Status: None   Collection Time: 03/31/17  5:29 AM  Result Value Ref Range Status   MRSA by PCR NEGATIVE NEGATIVE Final    Comment:        The GeneXpert MRSA Assay (FDA approved for NASAL specimens only), is one component of a comprehensive MRSA colonization surveillance program. It is not intended to diagnose MRSA infection nor to guide or monitor treatment for MRSA infections.        Blood Culture No results found for: SDES, SPECREQUEST, CULT, REPTSTATUS    Labs: Results for orders placed or performed during the hospital encounter of 03/30/17 (from the past 48 hour(s))  Lipase, blood     Status: None   Collection Time: 03/30/17  6:12 PM  Result Value Ref Range   Lipase  30 11 - 51 U/L  Comprehensive metabolic panel     Status: Abnormal   Collection Time: 03/30/17  6:12 PM  Result Value Ref Range   Sodium 139 135 - 145 mmol/L   Potassium 3.1 (L) 3.5 - 5.1 mmol/L   Chloride 103 101 - 111 mmol/L   CO2 20 (L) 22 - 32 mmol/L   Glucose, Bld 117 (H) 65 - 99 mg/dL   BUN 10 6 - 20 mg/dL   Creatinine, Ser 0.63 0.44 - 1.00 mg/dL   Calcium 9.8 8.9 - 10.3 mg/dL   Total Protein 7.8 6.5 - 8.1 g/dL   Albumin 4.7 3.5 - 5.0 g/dL   AST 25 15 - 41 U/L   ALT 20 14 - 54 U/L   Alkaline Phosphatase 82 38 - 126 U/L   Total Bilirubin 0.6 0.3 - 1.2 mg/dL   GFR calc non Af Amer >60 >60 mL/min   GFR calc Af Amer >60 >60 mL/min    Comment: (NOTE) The eGFR has been calculated using the CKD EPI equation. This calculation has not been validated in all clinical situations. eGFR's persistently <60 mL/min signify possible Chronic Kidney Disease.    Anion gap 16 (H) 5 - 15  CBC with Differential     Status:  Abnormal   Collection Time: 03/30/17  6:12 PM  Result Value Ref Range   WBC 7.6 4.0 - 10.5 K/uL   RBC 4.54 3.87 - 5.11 MIL/uL   Hemoglobin 15.1 (H) 12.0 - 15.0 g/dL   HCT 43.5 36.0 - 46.0 %   MCV 95.8 78.0 - 100.0 fL   MCH 33.3 26.0 - 34.0 pg   MCHC 34.7 30.0 - 36.0 g/dL   RDW 12.4 11.5 - 15.5 %   Platelets 290 150 - 400 K/uL   Neutrophils Relative % 46 %   Neutro Abs 3.5 1.7 - 7.7 K/uL   Lymphocytes Relative 37 %   Lymphs Abs 2.8 0.7 - 4.0 K/uL   Monocytes Relative 13 %   Monocytes Absolute 1.0 0.1 - 1.0 K/uL   Eosinophils Relative 3 %   Eosinophils Absolute 0.2 0.0 - 0.7 K/uL   Basophils Relative 1 %   Basophils Absolute 0.0 0.0 - 0.1 K/uL  I-stat troponin, ED     Status: None   Collection Time: 03/30/17  6:36 PM  Result Value Ref Range   Troponin i, poc 0.01 0.00 - 0.08 ng/mL   Comment 3            Comment: Due to the release kinetics of cTnI, a negative result within the first hours of the onset of symptoms does not rule out myocardial infarction with  certainty. If myocardial infarction is still suspected, repeat the test at appropriate intervals.   Troponin I     Status: Abnormal   Collection Time: 03/30/17  6:37 PM  Result Value Ref Range   Troponin I 0.04 (HH) <0.03 ng/mL    Comment: CRITICAL RESULT CALLED TO, READ BACK BY AND VERIFIED WITH: Cathe Mons [id number] @ 2153 BY J SCOTTON   APTT     Status: None   Collection Time: 03/31/17 12:05 AM  Result Value Ref Range   aPTT 29 24 - 36 seconds  Protime-INR     Status: None   Collection Time: 03/31/17 12:05 AM  Result Value Ref Range   Prothrombin Time 13.5 11.4 - 15.2 seconds   INR 1.02   Magnesium     Status: None   Collection Time: 03/31/17  1:03 AM  Result Value Ref Range   Magnesium 2.0 1.7 - 2.4 mg/dL  Troponin I (q 6hr x 3)     Status: Abnormal   Collection Time: 03/31/17  1:03 AM  Result Value Ref Range   Troponin I 36.97 (HH) <0.03 ng/mL    Comment: CRITICAL RESULT CALLED TO, READ BACK BY AND VERIFIED WITH: Cathe Mons [id number] @ 0157 BY J SCOTTON DELTA CHECK NOTED   Troponin I (q 6hr x 3)     Status: Abnormal   Collection Time: 03/31/17  2:05 AM  Result Value Ref Range   Troponin I 32.53 (HH) <0.03 ng/mL    Comment: CRITICAL VALUE NOTED.  VALUE IS CONSISTENT WITH PREVIOUSLY REPORTED AND CALLED VALUE.  I-stat troponin, ED     Status: Abnormal   Collection Time: 03/31/17  3:14 AM  Result Value Ref Range   Troponin i, poc 23.45 (HH) 0.00 - 0.08 ng/mL   Comment NOTIFIED PHYSICIAN    Comment 3            Comment: Due to the release kinetics of cTnI, a negative result within the first hours of the onset of symptoms does not rule out myocardial infarction with certainty. If myocardial infarction is still suspected, repeat the test  at appropriate intervals.   MRSA PCR Screening     Status: None   Collection Time: 03/31/17  5:29 AM  Result Value Ref Range   MRSA by PCR NEGATIVE NEGATIVE    Comment:        The GeneXpert MRSA Assay (FDA approved for NASAL  specimens only), is one component of a comprehensive MRSA colonization surveillance program. It is not intended to diagnose MRSA infection nor to guide or monitor treatment for MRSA infections.   Lipid panel     Status: None   Collection Time: 03/31/17  7:48 AM  Result Value Ref Range   Cholesterol 188 0 - 200 mg/dL   Triglycerides 100 <150 mg/dL   HDL 77 >40 mg/dL   Total CHOL/HDL Ratio 2.4 RATIO   VLDL 20 0 - 40 mg/dL   LDL Cholesterol 91 0 - 99 mg/dL    Comment:        Total Cholesterol/HDL:CHD Risk Coronary Heart Disease Risk Table                     Men   Women  1/2 Average Risk   3.4   3.3  Average Risk       5.0   4.4  2 X Average Risk   9.6   7.1  3 X Average Risk  23.4   11.0        Use the calculated Patient Ratio above and the CHD Risk Table to determine the patient's CHD Risk.        ATP III CLASSIFICATION (LDL):  <100     mg/dL   Optimal  100-129  mg/dL   Near or Above                    Optimal  130-159  mg/dL   Borderline  160-189  mg/dL   High  >190     mg/dL   Very High   Basic metabolic panel     Status: Abnormal   Collection Time: 03/31/17  8:09 AM  Result Value Ref Range   Sodium 139 135 - 145 mmol/L   Potassium 4.1 3.5 - 5.1 mmol/L   Chloride 108 101 - 111 mmol/L   CO2 19 (L) 22 - 32 mmol/L   Glucose, Bld 103 (H) 65 - 99 mg/dL   BUN 5 (L) 6 - 20 mg/dL   Creatinine, Ser 0.57 0.44 - 1.00 mg/dL   Calcium 8.9 8.9 - 10.3 mg/dL   GFR calc non Af Amer >60 >60 mL/min   GFR calc Af Amer >60 >60 mL/min    Comment: (NOTE) The eGFR has been calculated using the CKD EPI equation. This calculation has not been validated in all clinical situations. eGFR's persistently <60 mL/min signify possible Chronic Kidney Disease.    Anion gap 12 5 - 15  Pregnancy, urine     Status: None   Collection Time: 03/31/17  9:43 AM  Result Value Ref Range   Preg Test, Ur NEGATIVE NEGATIVE    Comment:        THE SENSITIVITY OF THIS METHODOLOGY IS >20 mIU/mL.    POCT Activated clotting time     Status: None   Collection Time: 03/31/17 10:35 AM  Result Value Ref Range   Activated Clotting Time 103 seconds  Troponin I (q 6hr x 3)     Status: Abnormal   Collection Time: 03/31/17  1:33 PM  Result Value Ref Range   Troponin  I 14.34 (HH) <0.03 ng/mL    Comment: CRITICAL RESULT CALLED TO, READ BACK BY AND VERIFIED WITH: [facility] RN @ 3299 03/31/17 LEONARD,A   Troponin I (q 6hr x 3)     Status: Abnormal   Collection Time: 03/31/17  8:35 PM  Result Value Ref Range   Troponin I 12.49 (HH) <0.03 ng/mL    Comment: CRITICAL VALUE NOTED.  VALUE IS CONSISTENT WITH PREVIOUSLY REPORTED AND CALLED VALUE.  Troponin I (q 6hr x 3)     Status: Abnormal   Collection Time: 04/01/17  1:29 AM  Result Value Ref Range   Troponin I 13.18 (HH) <0.03 ng/mL    Comment: CRITICAL VALUE NOTED.  VALUE IS CONSISTENT WITH PREVIOUSLY REPORTED AND CALLED VALUE.  Comprehensive metabolic panel     Status: Abnormal   Collection Time: 04/01/17  1:29 AM  Result Value Ref Range   Sodium 141 135 - 145 mmol/L   Potassium 3.6 3.5 - 5.1 mmol/L   Chloride 111 101 - 111 mmol/L   CO2 23 22 - 32 mmol/L   Glucose, Bld 109 (H) 65 - 99 mg/dL   BUN <5 (L) 6 - 20 mg/dL   Creatinine, Ser 0.51 0.44 - 1.00 mg/dL   Calcium 8.7 (L) 8.9 - 10.3 mg/dL   Total Protein 6.0 (L) 6.5 - 8.1 g/dL   Albumin 3.5 3.5 - 5.0 g/dL   AST 56 (H) 15 - 41 U/L   ALT 20 14 - 54 U/L   Alkaline Phosphatase 57 38 - 126 U/L   Total Bilirubin 0.9 0.3 - 1.2 mg/dL   GFR calc non Af Amer >60 >60 mL/min   GFR calc Af Amer >60 >60 mL/min    Comment: (NOTE) The eGFR has been calculated using the CKD EPI equation. This calculation has not been validated in all clinical situations. eGFR's persistently <60 mL/min signify possible Chronic Kidney Disease.    Anion gap 7 5 - 15  CBC WITH DIFFERENTIAL     Status: None   Collection Time: 04/01/17  1:29 AM  Result Value Ref Range   WBC 5.2 4.0 - 10.5 K/uL   RBC 4.10 3.87 -  5.11 MIL/uL   Hemoglobin 13.2 12.0 - 15.0 g/dL   HCT 39.8 36.0 - 46.0 %   MCV 97.1 78.0 - 100.0 fL   MCH 32.2 26.0 - 34.0 pg   MCHC 33.2 30.0 - 36.0 g/dL   RDW 12.8 11.5 - 15.5 %   Platelets 220 150 - 400 K/uL   Neutrophils Relative % 56 %   Neutro Abs 2.9 1.7 - 7.7 K/uL   Lymphocytes Relative 31 %   Lymphs Abs 1.6 0.7 - 4.0 K/uL   Monocytes Relative 10 %   Monocytes Absolute 0.5 0.1 - 1.0 K/uL   Eosinophils Relative 3 %   Eosinophils Absolute 0.1 0.0 - 0.7 K/uL   Basophils Relative 0 %   Basophils Absolute 0.0 0.0 - 0.1 K/uL     Lipid Panel     Component Value Date/Time   CHOL 188 03/31/2017 0748   TRIG 100 03/31/2017 0748   HDL 77 03/31/2017 0748   CHOLHDL 2.4 03/31/2017 0748   VLDL 20 03/31/2017 0748   LDLCALC 91 03/31/2017 0748     No results found for: HGBA1C   Lab Results  Component Value Date   LDLCALC 91 03/31/2017   CREATININE 0.51 04/01/2017     HPI :  51 year old female with  no significant past medical history,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 She reports having a good healthy diet and no family history of cardiac disease. After eating dinner last night she sat down at the computer to do some work and developed  very severe chest pain between her shoulder blades around 6pm, it was described as a spasm and a very sharp pain. She does have hx of shoulder problems and muscle spasms but this felt different. She told her husband she was hurting and he gave her an aspirin, her pain became unbearable and started to wrap around to her chest therefore she told him to take her to the emergency department. In the ER her initial Troponin was negative. The second one was slightly elevated at 0.04 and then the third one resulted at 36.97. She did have a dissection CT study that was negative for dissection or PE. She was started on Heparin and Nitro and transferred to [facility]. She is now pain free, her 4th troponin has trended  down to 23.45   HOSPITAL COURSE:   1. Acute myocardial infarction: Troponin < 0.03 << 0.04 << 36.97 >> 32.53 Patient started on heparin and nitro drip Seen by cardiology and scheduled for cardiac cath, results as above started on Plavix to take in addition to aspirin therapy.  Suspect possible acute vasospasm leading to transient occlusion with significant troponin elevation up to 37 and then followed by prompt resolution of chest pain, most likely resulting from spontaneous reperfusion.  She has an associated wall motion abnormality in the mid-distal LAD territory which I suspect is stunned myocardium rather than true scar and anticipate  improvement in LV function with time.  She will be started on amlodipine for potential antispasm benefit.  2. Alcohol dependence Patient advised to minimize alcohol use She has been started on a PPI, and she will also be on aspirin and Plavix   Discharge Exam: *  Blood pressure 126/85, pulse 93, temperature 98.4 F (36.9 C), temperature source Oral, resp. rate 18, height 5' 7"  (1.702 m), weight 67.5 kg (148 lb 13 oz), SpO2 98 %.   Heart: RRR with S1 S2. No murmurs, rubs, or gallops appreciated. Abdomen: Soft, non-tender, non-distended with normoactive bowel sounds. Msk:  Strength and tone appear normal for age. Extremities: No clubbing or cyanosis. No edema.  Neuro: Alert and oriented X 3. No facial asymmetry. Psych:  Responds to questions appropriately with a normal affect.   Follow-up Information    PCP. Call.   Why:  In follow-up in 3-5 days, hospital follow-up       Jettie Booze, MD. Call.   Specialties:  Cardiology, Radiology, Interventional Cardiology Why:  Hospital follow-up in one to 2 weeks Contact information: [address] [place] [address] [phone]           Signed: Reyne Dumas 04/01/2017, 7:59 AM        Time spent >1 hour

## 2017-04-01 NOTE — Progress Notes (Addendum)
Progress Note  Patient Name: Selena AdeJeannie E Vega Date of Encounter: 04/01/2017  Primary Cardiologist: Eldridge DaceVaranasi ( new)  Subjective   No further chest pain  Inpatient Medications    Scheduled Meds: . amLODipine  5 mg Oral Daily  . aspirin  81 mg Oral Daily  . atorvastatin  80 mg Oral q1800  . clopidogrel  75 mg Oral Q breakfast  . sodium chloride flush  3 mL Intravenous Q12H   Continuous Infusions: . sodium chloride     PRN Meds: sodium chloride, acetaminophen, diazepam, morphine injection, nitroGLYCERIN, ondansetron (ZOFRAN) IV, sodium chloride flush   Vital Signs    Vitals:   03/31/17 2004 03/31/17 2326 04/01/17 0417 04/01/17 0818  BP: 125/88 101/83 126/85 120/88  Pulse: 88 93 93 83  Resp: 17 17 18 16   Temp: 99 F (37.2 C) 98.4 F (36.9 C) 98.4 F (36.9 C) 98.2 F (36.8 C)  TempSrc: Oral Oral Oral Oral  SpO2: 97% 98% 98% 97%  Weight:      Height:        Intake/Output Summary (Last 24 hours) at 04/01/17 1019 Last data filed at 04/01/17 0933  Gross per 24 hour  Intake          2725.08 ml  Output                0 ml  Net          2725.08 ml   Filed Weights   03/30/17 1752 03/31/17 0523 03/31/17 0525  Weight: 150 lb (68 kg) 148 lb (67.1 kg) 148 lb 13 oz (67.5 kg)    Telemetry    NSR - Personally Reviewed  ECG    NSR, ant T wave inversion. - Personally Reviewed  Physical Exam   GEN: No acute distress.   Neck: No JVD Cardiac: RRR, no murmurs, rubs, or gallops.  Respiratory: Clear to auscultation bilaterally. GI: Soft, nontender, non-distended  MS: No edema; No deformity.  Right groin stable.No hematoma Neuro:  Nonfocal  Psych: Normal affect   Labs    Chemistry Recent Labs Lab 03/30/17 1812 03/31/17 0809 04/01/17 0129  NA 139 139 141  K 3.1* 4.1 3.6  CL 103 108 111  CO2 20* 19* 23  GLUCOSE 117* 103* 109*  BUN 10 5* <5*  CREATININE 0.63 0.57 0.51  CALCIUM 9.8 8.9 8.7*  PROT 7.8  --  6.0*  ALBUMIN 4.7  --  3.5  AST 25  --   56*  ALT 20  --  20  ALKPHOS 82  --  57  BILITOT 0.6  --  0.9  GFRNONAA >60 >60 >60  GFRAA >60 >60 >60  ANIONGAP 16* 12 7     Hematology Recent Labs Lab 03/30/17 1812 04/01/17 0129  WBC 7.6 5.2  RBC 4.54 4.10  HGB 15.1* 13.2  HCT 43.5 39.8  MCV 95.8 97.1  MCH 33.3 32.2  MCHC 34.7 33.2  RDW 12.4 12.8  PLT 290 220    Cardiac Enzymes Recent Labs Lab 03/31/17 0205 03/31/17 1333 03/31/17 2035 04/01/17 0129  TROPONINI 32.53* 14.34* 12.49* 13.18*    Recent Labs Lab 03/30/17 1836 03/31/17 0314  TROPIPOC 0.01 23.45*     BNPNo results for input(s): BNP, PROBNP in the last 168 hours.   DDimer No results for input(s): DDIMER in the last 168 hours.   Radiology    Dg Chest 2 View  Result Date: 03/30/2017 CLINICAL DATA:  Chest pain EXAM: CHEST  2 VIEW COMPARISON:  None. FINDINGS: The heart size and mediastinal contours are within normal limits. Both lungs are clear. The visualized skeletal structures are unremarkable. IMPRESSION: No active cardiopulmonary disease. Electronically Signed   By: Marlan Palauharles  Clark M.D.   On: 03/30/2017 18:36   Ct Angio Chest/abd/pel For Dissection W And/or Wo Contrast  Result Date: 03/30/2017 CLINICAL DATA:  51 year old female with sharp pain in the chest. Evaluate for dissection. EXAM: CT ANGIOGRAPHY CHEST, ABDOMEN AND PELVIS TECHNIQUE: Multidetector CT imaging through the chest, abdomen and pelvis was performed using the standard protocol during bolus administration of intravenous contrast. Multiplanar reconstructed images and MIPs were obtained and reviewed to evaluate the vascular anatomy. CONTRAST:  100 cc Isovue 370 COMPARISON:  Chest radiograph dated 03/30/2017 FINDINGS: CTA CHEST FINDINGS Cardiovascular: There is no cardiomegaly or pericardial effusion. The thoracic aorta is unremarkable. No aneurysmal dilatation or dissection. The origins of the great vessels of the aortic arch appear patent. No CT evidence of central pulmonary artery embolus.  Mediastinum/Nodes: There is no hilar or mediastinal adenopathy. The esophagus and the thyroid gland are grossly unremarkable. No mediastinal fluid collection or hematoma. Lungs/Pleura: The lungs are clear. There is no pleural effusion or pneumothorax. The central airways are patent. Musculoskeletal: No chest wall abnormality. No acute or significant osseous findings. Review of the MIP images confirms the above findings. CTA ABDOMEN AND PELVIS FINDINGS VASCULAR Aorta: Normal caliber aorta without aneurysm, dissection, vasculitis or significant stenosis. Celiac: There is focal aneurysmal dilatation of the celiac axis measuring up to 9 mm in diameter approximately 17 mm from the origin. No dissection. The celiac axis is patent. There is an accessory left hepatic artery arising from the left gastric artery. An accessory right hepatic artery arising from the SMA. SMA: Patent without evidence of aneurysm, dissection, vasculitis or significant stenosis. Renals: Both renal arteries are patent without evidence of aneurysm, dissection, vasculitis, fibromuscular dysplasia or significant stenosis. IMA: Patent without evidence of aneurysm, dissection, vasculitis or significant stenosis. Inflow: Patent without evidence of aneurysm, dissection, vasculitis or significant stenosis. Veins: No obvious venous abnormality within the limitations of this arterial phase study. Review of the MIP images confirms the above findings. NON-VASCULAR No intra-abdominal free air or free fluid. Hepatobiliary: No focal liver abnormality is seen. No gallstones, gallbladder wall thickening, or biliary dilatation. Pancreas: Unremarkable. No pancreatic ductal dilatation or surrounding inflammatory changes. Spleen: Normal in size without focal abnormality. Adrenals/Urinary Tract: Adrenal glands are unremarkable. Kidneys are normal, without renal calculi, focal lesion, or hydronephrosis. Bladder is unremarkable. Stomach/Bowel: Small sigmoid diverticula  without active inflammatory changes. There is no evidence of bowel obstruction or active inflammation. Normal appendix. Lymphatic: No adenopathy. Reproductive: Ill-defined uterine fibroids. Ultrasound may provide better evaluation of the pelvic structures. The ovaries are grossly unremarkable. Other: None Musculoskeletal: Degenerative changes and sclerotic changes of the L4. Mild diffuse disc bulge at L4-5. No acute osseous pathology. Review of the MIP images confirms the above findings. IMPRESSION: 1. No acute intrathoracic, abdominal, or pelvic pathology. No aortic aneurysm or dissection. No CT evidence of central pulmonary artery embolus. 2. 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 3. Small scattered sigmoid diverticula. No active inflammation or bowel obstruction. Normal appendix. 4. Small uterine fibroids. Ultrasound may provide better evaluation of the pelvic structures. Electronically Signed   By: Elgie CollardArash  Radparvar M.D.   On: 03/30/2017 23:45    Cardiac Studies   Cath images reviewed  Patient Profile     51 y.o. female s/p anterior  MI, unclear etiology  Assessment & Plan    1) MI: Unclear whether this was vasospasm vs. SCAD at a sharp bend in the LAD.  Amlodipine and DAPT started.  Discussed statin to decrease vascular inflammation.  She declines taking atorvastatin.  2) Would keep her today and plan for discharge tomorrow if she stays stable.  Given wall motion abnormality, will add metoprolol 12.5 mg BID.  I explained the rationale for the medicines.  3) She will have to refrain from travel for work for at least several weeks after hospital discharge.  She will need f/u echo 6 weeks after discharge.  Signed, Lance Muss, MD  04/01/2017, 10:19 AM

## 2017-04-02 NOTE — Progress Notes (Signed)
Progress Note  Patient Name: Selena Vega Date of Encounter: 04/02/2017  Primary Cardiologist: Eldridge DaceVaranasi  Subjective   Feels well.  Wants to go home.  Inpatient Medications    Scheduled Meds: . amLODipine  5 mg Oral Daily  . aspirin  81 mg Oral Daily  . atorvastatin  80 mg Oral q1800  . clopidogrel  75 mg Oral Q breakfast  . metoprolol tartrate  12.5 mg Oral BID  . sodium chloride flush  3 mL Intravenous Q12H   Continuous Infusions: . sodium chloride     PRN Meds: sodium chloride, acetaminophen, diazepam, morphine injection, nitroGLYCERIN, ondansetron (ZOFRAN) IV, sodium chloride flush   Vital Signs    Vitals:   04/01/17 1933 04/01/17 2347 04/02/17 0454 04/02/17 0731  BP: (!) 122/98 121/81 98/69   Pulse:    94  Resp: 15 (!) 21 15   Temp: 98.4 F (36.9 C) 98.5 F (36.9 C) 98 F (36.7 C) 98.8 F (37.1 C)  TempSrc: Oral Oral Oral Oral  SpO2: 95% 98% 98% 96%  Weight:      Height:        Intake/Output Summary (Last 24 hours) at 04/02/17 0920 Last data filed at 04/02/17 0900  Gross per 24 hour  Intake             1803 ml  Output                0 ml  Net             1803 ml   Filed Weights   03/30/17 1752 03/31/17 0523 03/31/17 0525  Weight: 150 lb (68 kg) 148 lb (67.1 kg) 148 lb 13 oz (67.5 kg)    Telemetry    NSR, sinus tach - Personally Reviewed  ECG      Physical Exam   GEN: No acute distress.   Neck: No JVD Cardiac: RRR, no murmurs, rubs, or gallops.  Respiratory: Clear to auscultation bilaterally. GI: Soft, nontender, non-distended  MS: No edema; No deformity. Neuro:  Nonfocal  Psych: Normal affect   Labs    Chemistry Recent Labs Lab 03/30/17 1812 03/31/17 0809 04/01/17 0129  NA 139 139 141  K 3.1* 4.1 3.6  CL 103 108 111  CO2 20* 19* 23  GLUCOSE 117* 103* 109*  BUN 10 5* <5*  CREATININE 0.63 0.57 0.51  CALCIUM 9.8 8.9 8.7*  PROT 7.8  --  6.0*  ALBUMIN 4.7  --  3.5  AST 25  --  56*  ALT 20  --  20  ALKPHOS 82   --  57  BILITOT 0.6  --  0.9  GFRNONAA >60 >60 >60  GFRAA >60 >60 >60  ANIONGAP 16* 12 7     Hematology Recent Labs Lab 03/30/17 1812 04/01/17 0129  WBC 7.6 5.2  RBC 4.54 4.10  HGB 15.1* 13.2  HCT 43.5 39.8  MCV 95.8 97.1  MCH 33.3 32.2  MCHC 34.7 33.2  RDW 12.4 12.8  PLT 290 220    Cardiac Enzymes Recent Labs Lab 03/31/17 0205 03/31/17 1333 03/31/17 2035 04/01/17 0129  TROPONINI 32.53* 14.34* 12.49* 13.18*    Recent Labs Lab 03/30/17 1836 03/31/17 0314  TROPIPOC 0.01 23.45*     BNPNo results for input(s): BNP, PROBNP in the last 168 hours.   DDimer No results for input(s): DDIMER in the last 168 hours.   Radiology    No results found.  Cardiac Studies   Cath results reviewed  with patient  Patient Profile     51 y.o. female who had an anterior MI  Assessment & Plan    Treating for vasospasm as possible cause of MI.  Continue metoprolol and amlodipine.  Dual antiplatelet therapy as well.    She is not interested in statin therapy.  Will arrange for f/u in the office in 2 weeks and echo in 6 weeks to assess LV function.  Out of work for 2 weeks, No travel for 4 weeks.       Signed, Lance MussJayadeep Jannett Schmall, MD  04/02/2017, 9:20 AM

## 2017-04-02 NOTE — Discharge Summary (Addendum)
Physician Discharge Summary  Selena Vega MRN: [id number] DOB/AGE: 1965/12/29 51 y.o.  PCP: Kelton Pillar, MD   Admit date: 03/30/2017 Discharge date: 04/02/2017  Discharge Diagnoses:    Principal Problem:   NSTEMI (non-ST elevated myocardial infarction) [facility]) Active Problems:   Chest pain   Hypokalemia   Elevated troponin   Decreased left ventricular systolic function     Follow-up recommendations Follow-up with PCP in 3-5 days , including all  additional recommended appointments as below Follow-up CBC, CMP in 3-5 days  refrain from travel for work for at least several weeks after hospital discharge.  She will need f/u echo 6 weeks after discharge.    Current Discharge Medication List    START taking these medications   Details  amLODipine (NORVASC) 5 MG tablet Take 1 tablet (5 mg total) by mouth daily. Qty: 30 tablet, Refills: 1    aspirin 81 MG chewable tablet Chew 1 tablet (81 mg total) by mouth daily. Qty: 30 tablet, Refills: 1    clopidogrel (PLAVIX) 75 MG tablet Take 1 tablet (75 mg total) by mouth daily with breakfast. Qty: 30 tablet, Refills: 1    metoprolol tartrate (LOPRESSOR) 25 MG tablet Take 0.5 tablets (12.5 mg total) by mouth 2 (two) times daily. Qty: 60 tablet, Refills: 1    omeprazole (PRILOSEC) 40 MG capsule Take 1 capsule (40 mg total) by mouth daily. Qty: 30 capsule, Refills: 1      CONTINUE these medications which have NOT CHANGED   Details  tetrahydrozoline-zinc (VISINE-AC) 0.05-0.25 % ophthalmic solution Place 2 drops into both eyes 3 (three) times daily as needed.    fluconazole (DIFLUCAN) 150 MG tablet Take 150 mg by mouth once.    nitrofurantoin, macrocrystal-monohydrate, (MACROBID) 100 MG capsule Take 100 mg by mouth 2 (two) times daily.      STOP taking these medications     ibuprofen (ADVIL,MOTRIN) 600 MG tablet      Ivermectin (SKLICE) 0.5 % LOTN      phenazopyridine (PYRIDIUM) 100 MG tablet          Discharge Condition:Stable Discharge Instructions Get Medicines reviewed and adjusted: Please take all your medications with you for your next visit with your Primary MD  Please request your Primary MD to go over all hospital tests and procedure/radiological results at the follow up, please ask your Primary MD to get all Hospital records sent to his/her office.  If you experience worsening of your admission symptoms, develop shortness of breath, life threatening emergency, suicidal or homicidal thoughts you must seek medical attention immediately by calling 911 or calling your MD immediately if symptoms less severe.  You must read complete instructions/literature along with all the possible adverse reactions/side effects for all the Medicines you take and that have been prescribed to you. Take any new Medicines after you have completely understood and accpet all the possible adverse reactions/side effects.   Do not drive when taking Pain medications.   Do not take more than prescribed Pain, Sleep and Anxiety Medications  Special Instructions: If you have smoked or chewed Tobacco in the last 2 yrs please stop smoking, stop any regular Alcohol and or any Recreational drug use.  Wear Seat belts while driving.  Please note  You were cared for by a hospitalist during your hospital stay. Once you are discharged, your primary care physician will handle any further medical issues. Please note that NO REFILLS for any discharge medications will be authorized once you are discharged, as it is  imperative that you return to your primary care physician (or establish a relationship with a primary care physician if you do not have one) for your aftercare needs so that they can reassess your need for medications and monitor your lab values.     Allergies  Allergen Reactions  . Sulfa Antibiotics Hives      Disposition:  home   Consults: * Cardiology    Significant Diagnostic  Studies:  Dg Chest 2 View  Result Date: 03/30/2017 CLINICAL DATA:  Chest pain EXAM: CHEST  2 VIEW COMPARISON:  None. FINDINGS: The heart size and mediastinal contours are within normal limits. Both lungs are clear. The visualized skeletal structures are unremarkable. IMPRESSION: No active cardiopulmonary disease. Electronically Signed   By: Franchot Gallo M.D.   On: 03/30/2017 18:36   Ct Angio Chest/abd/pel For Dissection W And/or Wo Contrast  Result Date: 03/30/2017 CLINICAL DATA:  51 year old female with sharp pain in the chest. Evaluate for dissection. EXAM: CT ANGIOGRAPHY CHEST, ABDOMEN AND PELVIS TECHNIQUE: Multidetector CT imaging through the chest, abdomen and pelvis was performed using the standard protocol during bolus administration of intravenous contrast. Multiplanar reconstructed images and MIPs were obtained and reviewed to evaluate the vascular anatomy. CONTRAST:  100 cc Isovue 370 COMPARISON:  Chest radiograph dated 03/30/2017 FINDINGS: CTA CHEST FINDINGS Cardiovascular: There is no cardiomegaly or pericardial effusion. The thoracic aorta is unremarkable. No aneurysmal dilatation or dissection. The origins of the great vessels of the aortic arch appear patent. No CT evidence of central pulmonary artery embolus. Mediastinum/Nodes: There is no hilar or mediastinal adenopathy. The esophagus and the thyroid gland are grossly unremarkable. No mediastinal fluid collection or hematoma. Lungs/Pleura: The lungs are clear. There is no pleural effusion or pneumothorax. The central airways are patent. Musculoskeletal: No chest wall abnormality. No acute or significant osseous findings. Review of the MIP images confirms the above findings. CTA ABDOMEN AND PELVIS FINDINGS VASCULAR Aorta: Normal caliber aorta without aneurysm, dissection, vasculitis or significant stenosis. Celiac: There is focal aneurysmal dilatation of the celiac axis measuring up to 9 mm in diameter approximately 17 mm from the origin.  No dissection. The celiac axis is patent. There is an accessory left hepatic artery arising from the left gastric artery. An accessory right hepatic artery arising from the SMA. SMA: Patent without evidence of aneurysm, dissection, vasculitis or significant stenosis. Renals: Both renal arteries are patent without evidence of aneurysm, dissection, vasculitis, fibromuscular dysplasia or significant stenosis. IMA: Patent without evidence of aneurysm, dissection, vasculitis or significant stenosis. Inflow: Patent without evidence of aneurysm, dissection, vasculitis or significant stenosis. Veins: No obvious venous abnormality within the limitations of this arterial phase study. Review of the MIP images confirms the above findings. NON-VASCULAR No intra-abdominal free air or free fluid. Hepatobiliary: No focal liver abnormality is seen. No gallstones, gallbladder wall thickening, or biliary dilatation. Pancreas: Unremarkable. No pancreatic ductal dilatation or surrounding inflammatory changes. Spleen: Normal in size without focal abnormality. Adrenals/Urinary Tract: Adrenal glands are unremarkable. Kidneys are normal, without renal calculi, focal lesion, or hydronephrosis. Bladder is unremarkable. Stomach/Bowel: Small sigmoid diverticula without active inflammatory changes. There is no evidence of bowel obstruction or active inflammation. Normal appendix. Lymphatic: No adenopathy. Reproductive: Ill-defined uterine fibroids. Ultrasound may provide better evaluation of the pelvic structures. The ovaries are grossly unremarkable. Other: None Musculoskeletal: Degenerative changes and sclerotic changes of the L4. Mild diffuse disc bulge at L4-5. No acute osseous pathology. Review of the MIP images confirms the above findings. IMPRESSION: 1. No acute intrathoracic,  abdominal, or pelvic pathology. No aortic aneurysm or dissection. No CT evidence of central pulmonary artery embolus. 2. 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 3. Small scattered sigmoid diverticula. No active inflammation or bowel obstruction. Normal appendix. 4. Small uterine fibroids. Ultrasound may provide better evaluation of the pelvic structures. Electronically Signed   By: Anner Crete M.D.   On: 03/30/2017 23:45     Left Heart Cath and Coronary Angiography   Mid LAD lesion, 40 %stenosed.  The left ventricular ejection fraction is 45-50% by visual estimate.  LV end diastolic pressure is mildly elevated.   Mild acute LV dysfunction with hypokinesis involving the mid distal anterolateral wall and apex.  Single vessel nonobstructive CAD with a sharp angle in the mid LAD segment, which may be a site of potential transient occlusion resulting from vasospasm.  There does not appear to be an angiographic spontaneous dissection, but the LAD distally is of smaller caliber than proximally.  No false lumen is visualized.  There was slight improvement following intracoronary nitroglycerin, but there still remained a residual 30-40% narrowing immediately beyond the sharp bend in the vessel.        Filed Weights   03/30/17 1752 03/31/17 0523 03/31/17 0525  Weight: 68 kg (150 lb) 67.1 kg (148 lb) 67.5 kg (148 lb 13 oz)     Microbiology: Recent Results (from the past 240 hour(s))  MRSA PCR Screening     Status: None   Collection Time: 03/31/17  5:29 AM  Result Value Ref Range Status   MRSA by PCR NEGATIVE NEGATIVE Final    Comment:        The GeneXpert MRSA Assay (FDA approved for NASAL specimens only), is one component of a comprehensive MRSA colonization surveillance program. It is not intended to diagnose MRSA infection nor to guide or monitor treatment for MRSA infections.        Blood Culture No results found for: SDES, SPECREQUEST, CULT, REPTSTATUS    Labs: Results for orders placed or performed during the hospital encounter of 03/30/17 (from the past 48 hour(s))  Basic  metabolic panel     Status: Abnormal   Collection Time: 03/31/17  8:09 AM  Result Value Ref Range   Sodium 139 135 - 145 mmol/L   Potassium 4.1 3.5 - 5.1 mmol/L   Chloride 108 101 - 111 mmol/L   CO2 19 (L) 22 - 32 mmol/L   Glucose, Bld 103 (H) 65 - 99 mg/dL   BUN 5 (L) 6 - 20 mg/dL   Creatinine, Ser 0.57 0.44 - 1.00 mg/dL   Calcium 8.9 8.9 - 10.3 mg/dL   GFR calc non Af Amer >60 >60 mL/min   GFR calc Af Amer >60 >60 mL/min    Comment: (NOTE) The eGFR has been calculated using the CKD EPI equation. This calculation has not been validated in all clinical situations. eGFR's persistently <60 mL/min signify possible Chronic Kidney Disease.    Anion gap 12 5 - 15  Pregnancy, urine     Status: None   Collection Time: 03/31/17  9:43 AM  Result Value Ref Range   Preg Test, Ur NEGATIVE NEGATIVE    Comment:        THE SENSITIVITY OF THIS METHODOLOGY IS >20 mIU/mL.   POCT Activated clotting time     Status: None   Collection Time: 03/31/17 10:35 AM  Result Value Ref Range   Activated Clotting Time 103 seconds  Troponin I (q 6hr x 3)     Status: Abnormal   Collection Time: 03/31/17  1:33 PM  Result Value Ref Range   Troponin I 14.34 (HH) <0.03 ng/mL    Comment: CRITICAL RESULT CALLED TO, READ BACK BY AND VERIFIED WITH: [facility] RN @ [phone] 03/31/17 LEONARD,A   Troponin I (q 6hr x 3)     Status: Abnormal   Collection Time: 03/31/17  8:35 PM  Result Value Ref Range   Troponin I 12.49 (HH) <0.03 ng/mL    Comment: CRITICAL VALUE NOTED.  VALUE IS CONSISTENT WITH PREVIOUSLY REPORTED AND CALLED VALUE.  Troponin I (q 6hr x 3)     Status: Abnormal   Collection Time: 04/01/17  1:29 AM  Result Value Ref Range   Troponin I 13.18 (HH) <0.03 ng/mL    Comment: CRITICAL VALUE NOTED.  VALUE IS CONSISTENT WITH PREVIOUSLY REPORTED AND CALLED VALUE.  HIV antibody (Routine Testing)     Status: None   Collection Time: 04/01/17  1:29 AM  Result Value Ref Range   HIV Screen 4th Generation wRfx Non  Reactive Non Reactive    Comment: (NOTE) Performed At: [facility] [place], [place] [id number] Lindon Romp MD CZ:[id number]   Comprehensive metabolic panel     Status: Abnormal   Collection Time: 04/01/17  1:29 AM  Result Value Ref Range   Sodium 141 135 - 145 mmol/L   Potassium 3.6 3.5 - 5.1 mmol/L   Chloride 111 101 - 111 mmol/L   CO2 23 22 - 32 mmol/L   Glucose, Bld 109 (H) 65 - 99 mg/dL   BUN <5 (L) 6 - 20 mg/dL   Creatinine, Ser 0.51 0.44 - 1.00 mg/dL   Calcium 8.7 (L) 8.9 - 10.3 mg/dL   Total Protein 6.0 (L) 6.5 - 8.1 g/dL   Albumin 3.5 3.5 - 5.0 g/dL   AST 56 (H) 15 - 41 U/L   ALT 20 14 - 54 U/L   Alkaline Phosphatase 57 38 - 126 U/L   Total Bilirubin 0.9 0.3 - 1.2 mg/dL   GFR calc non Af Amer >60 >60 mL/min   GFR calc Af Amer >60 >60 mL/min    Comment: (NOTE) The eGFR has been calculated using the CKD EPI equation. This calculation has not been validated in all clinical situations. eGFR's persistently <60 mL/min signify possible Chronic Kidney Disease.    Anion gap 7 5 - 15  CBC WITH DIFFERENTIAL     Status: None   Collection Time: 04/01/17  1:29 AM  Result Value Ref Range   WBC 5.2 4.0 - 10.5 K/uL   RBC 4.10 3.87 - 5.11 MIL/uL   Hemoglobin 13.2 12.0 - 15.0 g/dL   HCT 39.8 36.0 - 46.0 %   MCV 97.1 78.0 - 100.0 fL   MCH 32.2 26.0 - 34.0 pg   MCHC 33.2 30.0 - 36.0 g/dL   RDW 12.8 11.5 - 15.5 %   Platelets 220 150 - 400 K/uL   Neutrophils Relative % 56 %   Neutro Abs 2.9 1.7 - 7.7 K/uL   Lymphocytes Relative 31 %   Lymphs Abs 1.6 0.7 - 4.0 K/uL   Monocytes Relative 10 %   Monocytes Absolute 0.5 0.1 - 1.0 K/uL   Eosinophils Relative 3 %   Eosinophils Absolute 0.1 0.0 - 0.7 K/uL   Basophils Relative 0 %   Basophils Absolute 0.0 0.0 - 0.1 K/uL     Lipid Panel  Component Value Date/Time   CHOL 188 03/31/2017 0748   TRIG 100 03/31/2017 0748   HDL 77 03/31/2017 0748   CHOLHDL 2.4 03/31/2017 0748   VLDL 20 03/31/2017 0748    LDLCALC 91 03/31/2017 0748     No results found for: HGBA1C   Lab Results  Component Value Date   LDLCALC 91 03/31/2017   CREATININE 0.51 04/01/2017     HPI :  51 year old female with  no significant past medical history,who works in the [employer] and drinks 4-5 glasses of wine a day. She reports having a good healthy diet and no family history of cardiac disease. After eating dinner last night she sat down at the computer to do some work and developed  very severe chest pain between her shoulder blades around 6pm, it was described as a spasm and a very sharp pain. She does have hx of shoulder problems and muscle spasms but this felt different. She told her husband she was hurting and he gave her an aspirin, her pain became unbearable and started to wrap around to her chest therefore she told him to take her to the emergency department. In the ER her initial Troponin was negative. The second one was slightly elevated at 0.04 and then the third one resulted at 36.97. She did have a dissection CT study that was negative for dissection or PE. She was started on Heparin and Nitro and transferred to [facility]. She is now pain free, her 4th troponin has trended down to 23.45   HOSPITAL COURSE:   1. Acute myocardial infarction: Troponin < 0.03 << 0.04 << 36.97 >> 32.53 Patient started on heparin and nitro drip Seen by cardiology and scheduled for cardiac cath, results as above started on Plavix to take in addition to aspirin therapy.  Suspect possible acute vasospasm leading to transient occlusion with significant troponin elevation up to 37 and then followed by prompt resolution of chest pain, most likely resulting from spontaneous reperfusion.  She has an associated wall motion abnormality in the mid-distal LAD territory which I suspect is stunned myocardium rather than true scar and anticipate  improvement in LV function with time.  She will be started on amlodipine for potential  antispasm benefit. Also started on metoprolol 12.5 mg twice a day Patient advised to refrain from travel for work for at least several weeks, Follow up 2-D echo in 6 weeks after discharge  2. Alcohol dependence Patient advised to minimize alcohol use She has been started on a PPI, and she will also be on aspirin and Plavix   Discharge Exam: *  Blood pressure 98/69, pulse 94, temperature 98.8 F (37.1 C), temperature source Oral, resp. rate 15, height 5' 7"  (1.702 m), weight 67.5 kg (148 lb 13 oz), SpO2 96 %.   Heart: RRR with S1 S2. No murmurs, rubs, or gallops appreciated. Abdomen: Soft, non-tender, non-distended with normoactive bowel sounds. Msk:  Strength and tone appear normal for age. Extremities: No clubbing or cyanosis. No edema.  Neuro: Alert and oriented X 3. No facial asymmetry. Psych:  Responds to questions appropriately with a normal affect.   Follow-up Information    PCP. Call.   Why:  In follow-up in 3-5 days, hospital follow-up       Jettie Booze, MD. Call.   Specialties:  Cardiology, Radiology, Interventional Cardiology Why:  Hospital follow-up in one to 2 weeks Contact information: [address] [place] [address] [phone]  SignedReyne Dumas 04/02/2017, 8:06 AM        Time spent >1 hour

## 2017-04-02 NOTE — Care Management Note (Signed)
Case Management Note  Patient Details  Name: Selena Vega MRN: [id number] Date of Birth: 07/22/1966  Subjective/Objective:    Pt admitted with NSTEMI                Action/Plan:  Pt is independent from home.  CM will continue to follow for discharge needs   Expected Discharge Date:  04/02/17               Expected Discharge Plan:  Home/Self Care  In-House Referral:     Discharge planning Services  CM Consult  Post Acute Care Choice:    Choice offered to:     DME Arranged:    DME Agency:     HH Arranged:    HH Agency:     Status of Service:  In process, will continue to follow  If discussed at Long Length of Stay Meetings, dates discussed:    Additional Comments: 04/02/2017 Pt to discharge home today.  Pt has PCP and denied barriers to obtaining medications as prescribed.  No CM needs determined prior to discharge Cherylann ParrClaxton, Olamide Carattini S, RN 04/02/2017, 11:26 AM

## 2017-04-02 NOTE — Discharge Instructions (Signed)
°  Follow-up recommendations Follow-up with PCP in 3-5 days , including all  additional recommended appointments as below Follow-up CBC, CMP in 3-5 days  refrain from travel for work for at least several weeks after hospital discharge.  She will need f/u echo 6 weeks after discharge.

## 2017-04-05 ENCOUNTER — Telehealth: Payer: Self-pay | Admitting: Interventional Cardiology

## 2017-04-05 NOTE — Telephone Encounter (Signed)
Return TO Work paper dropped off by patient. Placed in [employer].

## 2017-04-05 NOTE — Telephone Encounter (Signed)
Spoke with patient who states the work letter that was written for her by Dr. Susie CassetteAbrol does not align with her conversation with Dr. Eldridge DaceVaranasi before she left the hospital. She dropped off paperwork today to be completed by Dr. Eldridge DaceVaranasi with the correct return to work information. I reviewed the paperwork and advised her that Dr. Eldridge DaceVaranasi will not be in the office this week. I offered to fax the notes from his visit with her where he states that she can return to work on 8/13 with no travel for an additional 2 weeks. She verbalized understanding and agreement and forms were faxed to her fax machine with confirmation received. She thanked me for the call.

## 2017-04-05 NOTE — Telephone Encounter (Signed)
New Message     Pt checking on back to work form, please call

## 2017-04-06 ENCOUNTER — Telehealth: Payer: Self-pay | Admitting: Interventional Cardiology

## 2017-04-06 NOTE — Telephone Encounter (Addendum)
Called and spoke to the patient. Made the patient aware that Dr. Eldridge DaceVaranasi is out of the office until next week. I made her aware that I could stamp the form if she thought that they would accept it. She said that they would accept a stamp. Form was stamped and Dr. Hoyle BarrVaranasi's note stating that the patient should be out of work for 2 weeks and no traveling for 4 weeks was faxed to number provided. Confirmation received that fax was successful.

## 2017-04-06 NOTE — Telephone Encounter (Signed)
New message    Eligha BridegroomMichelle Swinyer filled out a return to work note for pt and pt states the note was not signed and her work will not accept it until someone signs it. She needs someone to sign this as soon as possible. She requests a call back.

## 2017-04-15 DIAGNOSIS — Z7289 Other problems related to lifestyle: Secondary | ICD-10-CM | POA: Diagnosis not present

## 2017-04-15 DIAGNOSIS — I214 Non-ST elevation (NSTEMI) myocardial infarction: Secondary | ICD-10-CM | POA: Diagnosis not present

## 2017-04-19 ENCOUNTER — Encounter: Payer: Self-pay | Admitting: Cardiology

## 2017-04-19 ENCOUNTER — Ambulatory Visit (INDEPENDENT_AMBULATORY_CARE_PROVIDER_SITE_OTHER): Payer: BLUE CROSS/BLUE SHIELD | Admitting: Interventional Cardiology

## 2017-04-19 VITALS — BP 120/82 | HR 86 | Ht 67.0 in | Wt 149.0 lb

## 2017-04-19 DIAGNOSIS — I519 Heart disease, unspecified: Secondary | ICD-10-CM

## 2017-04-19 DIAGNOSIS — I25119 Atherosclerotic heart disease of native coronary artery with unspecified angina pectoris: Secondary | ICD-10-CM

## 2017-04-19 DIAGNOSIS — I214 Non-ST elevation (NSTEMI) myocardial infarction: Secondary | ICD-10-CM | POA: Diagnosis not present

## 2017-04-19 DIAGNOSIS — E785 Hyperlipidemia, unspecified: Secondary | ICD-10-CM | POA: Diagnosis not present

## 2017-04-19 MED ORDER — AMLODIPINE BESYLATE 5 MG PO TABS: 5.0000 mg | ORAL_TABLET | Freq: Every day | ORAL | 3 refills | Status: DC

## 2017-04-19 MED ORDER — ATORVASTATIN CALCIUM 40 MG PO TABS: 40.0000 mg | ORAL_TABLET | Freq: Every day | ORAL | 3 refills | Status: DC

## 2017-04-19 NOTE — Patient Instructions (Addendum)
Medication Instructions:  Your physician has recommended you make the following change in your medication:  1.  STOP the Metoprolol 2.  RESTART the Amlodipine 5 mg daily 3.  START Atorvastatin 40 mg taking 1 tablet at night time   Labwork: 8 WEEKS:  FASTING LIPID & LFT  Testing/Procedures: Your physician has requested that you have an echocardiogram IN 6 WEEKS. Echocardiography is a painless test that uses sound waves to create images of your heart. It provides your doctor with information about the size and shape of your heart and how well your heart's chambers and valves are working. This procedure takes approximately one hour. There are no restrictions for this procedure.    Follow-Up: Your physician recommends that you schedule a follow-up appointment in: 3-4 MONTHS WITH DR. VARANASI   Any Other Special Instructions Will Be Listed Below (If Applicable). Echocardiogram An echocardiogram, or echocardiography, uses sound waves (ultrasound) to produce an image of your heart. The echocardiogram is simple, painless, obtained within a short period of time, and offers valuable information to your health care provider. The images from an echocardiogram can provide information such as:  Evidence of coronary artery disease (CAD).  Heart size.  Heart muscle function.  Heart valve function.  Aneurysm detection.  Evidence of a past heart attack.  Fluid buildup around the heart.  Heart muscle thickening.  Assess heart valve function.  Tell a health care provider about:  Any allergies you have.  All medicines you are taking, including vitamins, herbs, eye drops, creams, and over-the-counter medicines.  Any problems you or family members have had with anesthetic medicines.  Any blood disorders you have.  Any surgeries you have had.  Any medical conditions you have.  Whether you are pregnant or may be pregnant. What happens before the procedure? No special preparation is  needed. Eat and drink normally. What happens during the procedure?  In order to produce an image of your heart, gel will be applied to your chest and a wand-like tool (transducer) will be moved over your chest. The gel will help transmit the sound waves from the transducer. The sound waves will harmlessly bounce off your heart to allow the heart images to be captured in real-time motion. These images will then be recorded.  You may need an IV to receive a medicine that improves the quality of the pictures. What happens after the procedure? You may return to your normal schedule including diet, activities, and medicines, unless your health care provider tells you otherwise. This information is not intended to replace advice given to you by your health care provider. Make sure you discuss any questions you have with your health care provider. Document Released: 08/14/2000 Document Revised: 04/04/2016 Document Reviewed: 04/24/2013 Elsevier Interactive Patient Education  2017 [employer].     If you need a refill on your cardiac medications before your next appointment, please call your pharmacy.

## 2017-04-19 NOTE — Progress Notes (Addendum)
04/19/2017 Selena Vega   02/20/1966  [id number]  Primary Physician Maurice Small, MD Primary Cardiologist: Dr. Eldridge Dace    Reason for Visit/CC: [facility] F/u for Chest Pain   HPI:  Selena Vega is a 51 y.o. female who is being seen today for post hospital f/u. She was recently admitted for chest pain. Troponins were cycled and significantly elevated, peaking at 32.52. She underwent LCH per Dr. Tresa Endo and was found to have a mid LAD lesion, 40 %stenosed. The left ventricular ejection fraction is 45-50% by visual estimate. LV end diastolic pressure was mildly elevated. LV gram showed mild acute LV dysfunction with hypokinesis involving the mid distal anterolateral wall and apex.  She was noted to have single vessel nonobstructive CAD with a sharp angle in the mid LAD segment, which may be a site of potential transient occlusion resulting from vasospasm. There did not appear to be an angiographic spontaneous dissection, but the LAD distally was of smaller caliber than proximally. No false lumen was visualized. There was slight improvement following intracoronary nitroglycerin, but there still remained a residual 30-40% narrowing immediately beyond the sharp bend in the vessel. She was placed on DAPT with ASA and Plavix. She was also started on amlodipine and metoprolol. Statin therapy was recommended, however pt was not interested in taking a statin (now reconsidering after talking with PCP).  She presents to clinic today for f/u. She has had no recurrent CP. She has occasional brief bouts with dyspnea, but nothing long lasting. No weight gain or LEE. Her main complaint has been medication intolerances. She felt poorly after starting metoprolol and amlodipine. Mostly fatigue. Her PCP felt that her BP was too low and instructed her to stop her amlodipine. She remains on metoprolol 12.5 mg BID along with ASA and Plavix. Her BP today is 120/82.      Current Meds    Medication Sig  . aspirin 81 MG chewable tablet Chew 1 tablet (81 mg total) by mouth daily.  . clopidogrel (PLAVIX) 75 MG tablet Take 1 tablet (75 mg total) by mouth daily with breakfast.  . metoprolol tartrate (LOPRESSOR) 25 MG tablet Take 0.5 tablets (12.5 mg total) by mouth 2 (two) times daily.  Marland Kitchen omeprazole (PRILOSEC) 40 MG capsule Take 1 capsule (40 mg total) by mouth daily.  Marland Kitchen tetrahydrozoline-zinc (VISINE-AC) 0.05-0.25 % ophthalmic solution Place 2 drops into both eyes 3 (three) times daily as needed.   Allergies  Allergen Reactions  . Sulfa Antibiotics Hives   No past medical history on file. Family History  Problem Relation Age of Onset  . Liver cancer Mother   . Heart disease Sister   . Hypothyroidism Sister    Past Surgical History:  Procedure Laterality Date  . CESAREAN SECTION    . LEFT HEART CATH AND CORONARY ANGIOGRAPHY N/A 03/31/2017   Procedure: Left Heart Cath and Coronary Angiography;  Surgeon: Lennette Bihari, MD;  Location: [facility] INVASIVE CV LAB;  Service: Cardiovascular;  Laterality: N/A;   Social History   Social History  . Marital status: Married    Spouse name: N/A  . Number of children: N/A  . Years of education: N/A   Occupational History  . Not on file.   Social History Main Topics  . Smoking status: Never Smoker  . Smokeless tobacco: Never Used  . Alcohol use Yes     Comment: wine daily  . Drug use: No  . Sexual activity: Not on file   Other Topics Concern  .  Not on file   Social History Narrative  . No narrative on file     Review of Systems: General: negative for chills, fever, night sweats or weight changes.  Cardiovascular: negative for chest pain, dyspnea on exertion, edema, orthopnea, palpitations, paroxysmal nocturnal dyspnea or shortness of breath Dermatological: negative for rash Respiratory: negative for cough or wheezing Urologic: negative for hematuria Abdominal: negative for nausea, vomiting, diarrhea, bright red blood per  rectum, melena, or hematemesis Neurologic: negative for visual changes, syncope, or dizziness All other systems reviewed and are otherwise negative except as noted above.   Physical Exam:  Blood pressure 120/82, pulse 86, height 5\' 7"  (1.702 m), weight 149 lb (67.6 kg).  General appearance: alert, cooperative and no distress Neck: no carotid bruit and no JVD Lungs: clear to auscultation bilaterally Heart: regular rate and rhythm, S1, S2 normal, no murmur, click, rub or gallop Extremities: extremities normal, atraumatic, no cyanosis or edema Pulses: 2+ and symmetric Skin: Skin color, texture, turgor normal. No rashes or lesions Neurologic: Grossly normal  EKG not performed -- personally reviewed   ASSESSMENT AND PLAN:   1. S/p NSTEMI: felt likely related to coronary vasospasm. Cath with single vessel nonobstructive CAD, 40% mid LAD. There did not appear to be an angiographic spontaneous dissection, but the LAD distally was of smaller caliber than proximally. No false lumen was visualized. There was slight improvement following intracoronary nitroglycerin, but there still remained a residual 30-40% narrowing immediately beyond the sharp bend in the vessel. She has not had any recurrent CP. Continue ASA and Plavix. Her amlodipine was stopped by her PCP as she felt poorly with this along with metoprolol, mainly due to fatigue. This was discussed with Dr. Eldridge Dace. We feel that the metoprolol is likely the cause of her fatigue. In addition, she would get more benefit by being on amlodipine, given suspected vasospasms. We will therefore discontinue metoprolol and have patient restart amlodipine 5 mg. We discussed again addition of statin therapy. LDL was 91 mg/dL. Pt is now in agreement to start statin. We will start 40 mg of Lipitor nightly. Recheck FLP and HFTs in 6 weeks. We will also obtain a f/u 2D echo in 6 weeks. F/u with Dr. Eldridge Dace again in 4 months.    Selena Vega, M[facility] 04/19/2017 11:17 AM   I have examined the patient and reviewed assessment and plan and discussed with patient.  Agree with above as stated.  GEN: Well nourished, well developed, in no acute distress  HEENT: normal  Neck: no JVD, carotid bruits, or masses Cardiac: RRR; no murmurs, rubs, or gallops,no edema  Respiratory:  clear to auscultation bilaterally, normal work of breathing GI: soft, nontender, nondistended,  MS: no deformity or atrophy  Skin: warm and dry, no rash Neuro:  Strength and sensation are intact Psych: euthymic mood, full affect  SHe is feeling some fatigue.  Will stop beta blocker and start amlodipine back to treat spasm.  Start lower dose lipitor.  LDL target 70 given MI.  She is feeling well enough to travel for work.  Recheck echo to eval LV function.  Lance Muss

## 2017-04-21 ENCOUNTER — Telehealth: Payer: Self-pay | Admitting: Interventional Cardiology

## 2017-04-21 NOTE — Telephone Encounter (Signed)
[facility] ( Dr. Lynett Fish Office) is calling to see if its ok for Mrs. Selena Vega to have her teeth clean . Please call

## 2017-04-21 NOTE — Telephone Encounter (Signed)
Left message for Carol to call back

## 2017-04-21 NOTE — Telephone Encounter (Signed)
Okey Regal calling from Dr. Sinclair Ship office and requesting that they have clearance specifically from the doctor for patient to have her teeth cleaned to put in her file. Patient does not need to hold ASA or Plavix for cleanings. Clearance to be faxed to [phone].

## 2017-04-22 NOTE — Telephone Encounter (Signed)
Faxed to Okey Regal at Dr. Sinclair Ship office through [facility].

## 2017-04-22 NOTE — Telephone Encounter (Signed)
No cardiac issues with teeth cleaning.  No SBE prophylaxis needed.

## 2017-05-04 ENCOUNTER — Telehealth: Payer: Self-pay | Admitting: Interventional Cardiology

## 2017-05-04 DIAGNOSIS — R0602 Shortness of breath: Secondary | ICD-10-CM

## 2017-05-04 NOTE — Telephone Encounter (Signed)
Called patient and she states that she has been having intermittent episodes throughout the day where she feels like she can't catch her breath. She states that it is worse when she is lying down. Patient denies having any chest pain or any symptoms at this time, but states that she feels like her breathing pattern is not the same. Patient states that she does become lightheaded sometimes with position changes. Patient's BP has been 123/91 and 122/98 HR 90-98. Patient is concerned that her DBP is higher. Patient had recent cath on 8/1. Patient was taking metoprolol and amlodipine and PCP d/c'd amlodipine d/t hypotension. At recent OV on 8/20 patient instructed to stop metoprolol d/t fatigue and to restart amlodipine 5 mg QD to treat vasospasm. Patient states that she has been taking her amlodipine 5 mg QD as well as the ASA and Plavix. Patient is scheduled for an echo on 05/31/17 and OV with Dr. Eldridge DaceVaranasi on 07/20/17.   Patient states that [place] is [place] to go to [place] on [place] and wants to know if this is still okay, if she needs to have her echo done sooner, and if she should be seen sooner. Made patient aware that the information would be forwarded to Dr. Eldridge DaceVaranasi for review and recommendation. Patient verbalized understanding.

## 2017-05-04 NOTE — Telephone Encounter (Signed)
Pt states that she had a weird heart attack on 03/30/17 she states she was tolled that her arteries had some kind of spasm. Pt states that she is still having randomly chest pressure and SOB at rest and with activity. Pt  feels that she has to take deep breaths. Pt also states her diastolic  Pressure  is running high. BP has been 123/91, now is 122/98  HR 98 beats/minute. Pt has a F/U appointment for an echo on 05/31/17 and   with Dr Eldridge DaceVaranasi on 11/20.

## 2017-05-04 NOTE — Telephone Encounter (Signed)
Selena Vega is calling because she is still having problems getting her breath and discomfort in her chest . She had a heart attack on 03/30/17 . Please call

## 2017-05-05 NOTE — Telephone Encounter (Signed)
1) Yes. 2)I think it is ok for her to go, as long as she does not have sx like she had before the MI.  Hopefully, her sx will improve with the med change. 3) No. Unlikely to see a change if we do not leave enough time.   [facility].  Could add diuretic if there eis evidence of volume overload.

## 2017-05-05 NOTE — Telephone Encounter (Signed)
Decrease atorvastatin to 20 mg daily and see if there is any difference.  Continue to stay hydrated.  If BP stays high, could add back lower dose of metoprolol.

## 2017-05-06 NOTE — Telephone Encounter (Signed)
Left message for patient to call back  

## 2017-05-06 NOTE — Telephone Encounter (Signed)
Patient made aware of Dr. Hoyle BarrVaranasi's recommendations.   Patient states that she never started atorvastatin. Patient states that she is only taking amlodipine, ASA, plavix, and omeprazole. Made patient aware that I would let Dr. Eldridge DaceVaranasi know.  BNP and BMET ordered for tomorrow.

## 2017-05-07 ENCOUNTER — Other Ambulatory Visit (INDEPENDENT_AMBULATORY_CARE_PROVIDER_SITE_OTHER): Payer: BLUE CROSS/BLUE SHIELD

## 2017-05-07 DIAGNOSIS — R0602 Shortness of breath: Secondary | ICD-10-CM

## 2017-05-08 LAB — BASIC METABOLIC PANEL
BUN/Creatinine Ratio: 17 (ref 9–23)
BUN: 10 mg/dL (ref 6–24)
CO2: 24 mmol/L (ref 20–29)
CREATININE: 0.58 mg/dL (ref 0.57–1.00)
Calcium: 9.6 mg/dL (ref 8.7–10.2)
Chloride: 103 mmol/L (ref 96–106)
GFR, EST AFRICAN AMERICAN: 123 mL/min/{1.73_m2} (ref >59)
GFR, EST NON AFRICAN AMERICAN: 106 mL/min/{1.73_m2} (ref >59)
Glucose: 90 mg/dL (ref 65–99)
POTASSIUM: 4.4 mmol/L (ref 3.5–5.2)
SODIUM: 141 mmol/L (ref 134–144)

## 2017-05-08 LAB — PRO B NATRIURETIC PEPTIDE: NT-Pro BNP: 589 pg/mL — ABNORMAL HIGH (ref 0–249)

## 2017-05-10 ENCOUNTER — Telehealth: Payer: Self-pay | Admitting: Interventional Cardiology

## 2017-05-10 MED ORDER — FUROSEMIDE 40 MG PO TABS: 40.0000 mg | ORAL_TABLET | Freq: Every day | ORAL | 0 refills | Status: DC

## 2017-05-10 MED ORDER — POTASSIUM CHLORIDE CRYS ER 20 MEQ PO TBCR: 20.0000 meq | EXTENDED_RELEASE_TABLET | Freq: Every day | ORAL | 0 refills | Status: DC

## 2017-05-10 NOTE — Telephone Encounter (Signed)
Patient aware, see phone note from 9/10.

## 2017-05-10 NOTE — Telephone Encounter (Signed)
Elevated BNP.  Start Lasix 40 mg and KCl 20 mEq daily.  After a few days, we may be able to decrease dose to 20 mg daily or prn depending on her sx.

## 2017-05-10 NOTE — Telephone Encounter (Signed)
Pt calling for Selena Vega regarding her lab work-pls call

## 2017-05-10 NOTE — Telephone Encounter (Signed)
Patient made aware of results.  Made patient aware of Dr. Hoyle BarrVaranasi's recommendations to start lasix 40 mg QD and KCl 20 mEq QD. Rx sent to patient's preferred pharmacy. Patient verbalizes understanding.

## 2017-05-31 ENCOUNTER — Other Ambulatory Visit: Payer: Self-pay

## 2017-05-31 ENCOUNTER — Other Ambulatory Visit: Payer: Self-pay | Admitting: Interventional Cardiology

## 2017-05-31 ENCOUNTER — Other Ambulatory Visit (HOSPITAL_COMMUNITY): Payer: BLUE CROSS/BLUE SHIELD

## 2017-05-31 ENCOUNTER — Ambulatory Visit (HOSPITAL_COMMUNITY): Payer: BLUE CROSS/BLUE SHIELD | Attending: Internal Medicine

## 2017-05-31 DIAGNOSIS — I340 Nonrheumatic mitral (valve) insufficiency: Secondary | ICD-10-CM | POA: Insufficient documentation

## 2017-05-31 DIAGNOSIS — I519 Heart disease, unspecified: Secondary | ICD-10-CM | POA: Diagnosis not present

## 2017-06-01 ENCOUNTER — Telehealth: Payer: Self-pay | Admitting: Cardiology

## 2017-06-01 NOTE — Telephone Encounter (Signed)
Left a message for the pt to call back and request to speak with a triage Nurse.

## 2017-06-01 NOTE — Telephone Encounter (Signed)
Follow Up:    Pt wants to know if Echo results are back from yesterday please.

## 2017-06-02 ENCOUNTER — Other Ambulatory Visit: Payer: Self-pay | Admitting: *Deleted

## 2017-06-02 ENCOUNTER — Telehealth: Payer: Self-pay | Admitting: *Deleted

## 2017-06-02 MED ORDER — OMEPRAZOLE 40 MG PO CPDR: 40.0000 mg | DELAYED_RELEASE_CAPSULE | Freq: Every day | ORAL | 1 refills | Status: DC

## 2017-06-02 MED ORDER — ASPIRIN 81 MG PO CHEW: 81.0000 mg | CHEWABLE_TABLET | Freq: Every day | ORAL | 1 refills | Status: DC

## 2017-06-02 MED ORDER — CLOPIDOGREL BISULFATE 75 MG PO TABS: 75.0000 mg | ORAL_TABLET | Freq: Every day | ORAL | 1 refills | Status: DC

## 2017-06-02 NOTE — Telephone Encounter (Signed)
Made patient aware that her echo results still need to be reviewed by Robbie Lis, PA. Preliminary results given. Patient verbalized understanding and thanked me for the call.

## 2017-06-02 NOTE — Telephone Encounter (Signed)
These medications were prescribed by Richarda Overlie, MD. She is requesting that  Dr Eldridge Dace refill them. Is this okay? Please advise. Thanks, MI

## 2017-06-02 NOTE — Telephone Encounter (Signed)
-----   Message from [place], [place] sent at 06/02/2017  3:19 PM EDT ----- Pump function of heart is improved and back to normal. EF is 60-65%. Continue c/u with Dr. Eldridge Dace as directed.

## 2017-06-04 ENCOUNTER — Other Ambulatory Visit: Payer: Self-pay | Admitting: Interventional Cardiology

## 2017-06-10 NOTE — Telephone Encounter (Signed)
Notes recorded by Jacqlyn Krauss, RN on 06/07/2017 at 4:39 PM EDT Discussed results with patient, verbalized understanding, thanked me for call.

## 2017-06-21 ENCOUNTER — Other Ambulatory Visit: Payer: BLUE CROSS/BLUE SHIELD

## 2017-06-24 ENCOUNTER — Other Ambulatory Visit: Payer: BLUE CROSS/BLUE SHIELD | Admitting: *Deleted

## 2017-06-24 DIAGNOSIS — E785 Hyperlipidemia, unspecified: Secondary | ICD-10-CM | POA: Diagnosis not present

## 2017-06-25 LAB — HEPATIC FUNCTION PANEL
ALT: 19 IU/L (ref 0–32)
AST: 25 IU/L (ref 0–40)
Albumin: 4.5 g/dL (ref 3.5–5.5)
Alkaline Phosphatase: 97 IU/L (ref 39–117)
Bilirubin Total: 0.6 mg/dL (ref 0.0–1.2)
Bilirubin, Direct: 0.15 mg/dL (ref 0.00–0.40)
TOTAL PROTEIN: 6.7 g/dL (ref 6.0–8.5)

## 2017-06-25 LAB — LIPID PANEL
CHOL/HDL RATIO: 2.9 ratio (ref 0.0–4.4)
Cholesterol, Total: 203 mg/dL — ABNORMAL HIGH (ref 100–199)
HDL: 69 mg/dL (ref >39)
LDL Calculated: 106 mg/dL — ABNORMAL HIGH (ref 0–99)
Triglycerides: 142 mg/dL (ref 0–149)
VLDL CHOLESTEROL CAL: 28 mg/dL (ref 5–40)

## 2017-06-28 ENCOUNTER — Other Ambulatory Visit: Payer: BLUE CROSS/BLUE SHIELD

## 2017-06-29 ENCOUNTER — Telehealth: Payer: Self-pay | Admitting: *Deleted

## 2017-06-29 NOTE — Telephone Encounter (Signed)
-----   Message from Tiffin[place]ittainy M Simmons, [place] sent at 06/29/2017  2:26 PM EDT ----- Liver function is normal. LDL (bad cholesterol is higher than previous). Call pt to see if she has been compliant with Lipitor nightly. If not, she should improve compliance. If unable to tolerate Lipitor due to side effects, then we can consider alternatives.

## 2017-06-29 NOTE — Telephone Encounter (Signed)
Patient informed and said she is not taking her lipitor and also ate sausage and grits when she did her lab work done that morning. Patient taught the importance of statin therapy to reduce the risk of having another MI. Patient stated that she will start taking her lipitor 40 mg daily at bedtime.

## 2017-07-05 ENCOUNTER — Other Ambulatory Visit: Payer: Self-pay | Admitting: *Deleted

## 2017-07-05 MED ORDER — ASPIRIN 81 MG PO CHEW: 81.0000 mg | CHEWABLE_TABLET | Freq: Every day | ORAL | 3 refills | Status: DC

## 2017-07-05 MED ORDER — POTASSIUM CHLORIDE CRYS ER 20 MEQ PO TBCR: 20.0000 meq | EXTENDED_RELEASE_TABLET | Freq: Every day | ORAL | 2 refills | Status: DC

## 2017-07-05 MED ORDER — FUROSEMIDE 40 MG PO TABS: 40.0000 mg | ORAL_TABLET | Freq: Every day | ORAL | 2 refills | Status: DC

## 2017-07-05 NOTE — Addendum Note (Signed)
Addended by: Valrie HartISLEY, Dannetta Lekas L on: 07/05/2017 10:59 AM   Modules accepted: Orders

## 2017-07-08 ENCOUNTER — Ambulatory Visit (INDEPENDENT_AMBULATORY_CARE_PROVIDER_SITE_OTHER): Payer: BLUE CROSS/BLUE SHIELD | Admitting: Cardiology

## 2017-07-08 ENCOUNTER — Encounter: Payer: Self-pay | Admitting: Cardiology

## 2017-07-08 VITALS — BP 114/70 | HR 75 | Ht 67.0 in | Wt 149.8 lb

## 2017-07-08 DIAGNOSIS — E785 Hyperlipidemia, unspecified: Secondary | ICD-10-CM | POA: Diagnosis not present

## 2017-07-08 DIAGNOSIS — I214 Non-ST elevation (NSTEMI) myocardial infarction: Secondary | ICD-10-CM

## 2017-07-08 DIAGNOSIS — E876 Hypokalemia: Secondary | ICD-10-CM | POA: Diagnosis not present

## 2017-07-08 MED ORDER — NITROGLYCERIN 0.4 MG SL SUBL: 0.4000 mg | SUBLINGUAL_TABLET | SUBLINGUAL | 3 refills | Status: DC | PRN

## 2017-07-08 NOTE — Progress Notes (Signed)
07/08/2017 Selena SlaterJeannie E Vega   11/23/1965  [id number]  Primary Physician Selena SmallGriffin, Elaine, MD Primary Cardiologist: Dr. Eldridge Vega   Reason for Visit/CC: Chest Pain  HPI:  51 y/o female, followed by Dr. Eldridge Vega with h/o MI. She was admitted in August of this year for chest pain. Troponins were cycled and significantly elevated, peaking at 32.52. She underwent LCH per Dr. Tresa Vega and was found to have a mid LAD lesion, 40 %stenosed. The left ventricular ejection fraction is 45-50% by visual estimate. LV end diastolic pressure was mildly elevated. LV gram showed mild acute LV dysfunction with hypokinesis involving the mid distal anterolateral wall and apex.  She was noted to have single vessel nonobstructive CAD with a sharp angle in the mid LAD segment, which may be a site of potential transient occlusion resulting from vasospasm. There did not appear to be an angiographic spontaneous dissection, but the LAD distally was of smaller caliber than proximally. No false lumen was visualized. There was slight improvement following intracoronary nitroglycerin, but there still remained a residual 30-40% narrowing immediately beyond the sharp bend in the vessel. She was placed on DAPT with ASA and Plavix. She was also started on amlodipine and Lipitor. She had a repeat echo which showed improvement in LVEF back to normal range at 60-65%.  She presents back to clinic today for f/u. She has been having left side chest pain. Symptoms seem c/w musculoskeletal etiology. Hurts to press on chest wall. No exertional CP or dyspnea. Unlike what she felt when she had her MI. VSS. BP is well controlled.     No outpatient medications have been marked as taking for the 07/08/17 encounter (Office Visit) with Selena Vega, Selena Deshler M, PA-C.   Allergies  Allergen Reactions  . Sulfa Antibiotics Hives   No past medical history on file. Family History  Problem Relation Age of Onset  . Liver cancer Mother   . Heart  disease Sister   . Hypothyroidism Sister    Past Surgical History:  Procedure Laterality Date  . CESAREAN SECTION     Social History   Socioeconomic History  . Marital status: Married    Spouse name: Not on file  . Number of children: Not on file  . Years of education: Not on file  . Highest education level: Not on file  Social Needs  . Financial resource strain: Not on file  . Food insecurity - worry: Not on file  . Food insecurity - inability: Not on file  . Transportation needs - medical: Not on file  . Transportation needs - non-medical: Not on file  Occupational History  . Not on file  Tobacco Use  . Smoking status: Never Smoker  . Smokeless tobacco: Never Used  Substance and Sexual Activity  . Alcohol use: Yes    Comment: wine daily  . Drug use: No  . Sexual activity: Not on file  Other Topics Concern  . Not on file  Social History Narrative  . Not on file     Review of Systems: General: negative for chills, fever, night sweats or weight changes.  Cardiovascular: negative for chest pain, dyspnea on exertion, edema, orthopnea, palpitations, paroxysmal nocturnal dyspnea or shortness of breath Dermatological: negative for rash Respiratory: negative for cough or wheezing Urologic: negative for hematuria Abdominal: negative for nausea, vomiting, diarrhea, bright red blood per rectum, melena, or hematemesis Neurologic: negative for visual changes, syncope, or dizziness All other systems reviewed and are otherwise negative except as noted above.  Physical Exam:  Blood pressure 114/70, pulse 75, height 5\' 7"  (1.702 m), weight 149 lb 12.8 oz (67.9 kg), SpO2 98 %.  General appearance: alert, cooperative and no distress Neck: no carotid bruit and no JVD Lungs: clear to auscultation bilaterally Heart: regular rate and rhythm, S1, S2 normal, no murmur, click, rub or gallop Extremities: extremities normal, atraumatic, no cyanosis or edema Pulses: 2+ and  symmetric Skin: Skin color, texture, turgor normal. No rashes or lesions Neurologic: Grossly normal  EKG not performed -- personally reviewed   ASSESSMENT AND PLAN:   1. CAD: NSTEMI 03/2017. felt likely related to coronary vasospasm. Cath with single vessel nonobstructive CAD, 40% mid LAD. There did not appear to be an angiographic spontaneous dissection, but the LAD distally was of smaller caliber than proximally. No false lumen was visualized. There was slight improvement following intracoronary nitroglycerin, but there still remained a residual 30-40% narrowing immediately beyond the sharp bend in the vessel. She has been managed with medical therapy. No anginal symptoms. Continue ASA, Plavix, Lipitor and amlodipine.   2. HLD: recent LDL was 106. Pt admits that she has not been compliant with statin. We discussed importance of full compliance. LDL goal is < 70 mg/dL. We will recheck FLP and HFTs in 8 weeks.    Selena Truax Delmer IslamSimmons PA-C, [facility] 07/08/2017 3:23 PM

## 2017-07-08 NOTE — Patient Instructions (Addendum)
Your physician recommends that you continue on your current medications as directed. Please refer to the Current Medication list given to you today.  Your physician recommends that you return for lab work in: 8 WEEKS FASTING LIPID LIVER   Nitroglycerin sublingual tablets What is this medicine? NITROGLYCERIN (nye troe GLI ser in) is a type of vasodilator. It relaxes blood vessels, increasing the blood and oxygen supply to your heart. This medicine is used to relieve chest pain caused by angina. It is also used to prevent chest pain before activities like climbing stairs, going outdoors in cold weather, or sexual activity. This medicine may be used for other purposes; ask your health care provider or pharmacist if you have questions. COMMON BRAND NAME(S): Nitroquick, Nitrostat, Nitrotab What should I tell my health care provider before I take this medicine? They need to know if you have any of these conditions: -anemia -head injury, recent stroke, or bleeding in the brain -liver disease -previous heart attack -an unusual or allergic reaction to nitroglycerin, other medicines, foods, dyes, or preservatives -pregnant or trying to get pregnant -breast-feeding How should I use this medicine? Take this medicine by mouth as needed. At the first sign of an angina attack (chest pain or tightness) place one tablet under your tongue. You can also take this medicine 5 to 10 minutes before an event likely to produce chest pain. Follow the directions on the prescription label. Let the tablet dissolve under the tongue. Do not swallow whole. Replace the dose if you accidentally swallow it. It will help if your mouth is not dry. Saliva around the tablet will help it to dissolve more quickly. Do not eat or drink, smoke or chew tobacco while a tablet is dissolving. If you are not better within 5 minutes after taking ONE dose of nitroglycerin, call 9-1-1 immediately to seek emergency medical care. Do not take more  than 3 nitroglycerin tablets over 15 minutes. If you take this medicine often to relieve symptoms of angina, your doctor or health care professional may provide you with different instructions to manage your symptoms. If symptoms do not go away after following these instructions, it is important to call 9-1-1 immediately. Do not take more than 3 nitroglycerin tablets over 15 minutes. Talk to your pediatrician regarding the use of this medicine in children. Special care may be needed. Overdosage: If you think you have taken too much of this medicine contact a poison control center or emergency room at once. NOTE: This medicine is only for you. Do not share this medicine with others. What if I miss a dose? This does not apply. This medicine is only used as needed. What may interact with this medicine? Do not take this medicine with any of the following medications: -certain migraine medicines like ergotamine and dihydroergotamine (DHE) -medicines used to treat erectile dysfunction like sildenafil, tadalafil, and vardenafil -riociguat This medicine may also interact with the following medications: -alteplase -aspirin -heparin -medicines for high blood pressure -medicines for mental depression -other medicines used to treat angina -phenothiazines like chlorpromazine, mesoridazine, prochlorperazine, thioridazine This list may not describe all possible interactions. Give your health care provider a list of all the medicines, herbs, non-prescription drugs, or dietary supplements you use. Also tell them if you smoke, drink alcohol, or use illegal drugs. Some items may interact with your medicine. What should I watch for while using this medicine? Tell your doctor or health care professional if you feel your medicine is no longer working. Keep this medicine  with you at all times. Sit or lie down when you take your medicine to prevent falling if you feel dizzy or faint after using it. Try to remain  calm. This will help you to feel better faster. If you feel dizzy, take several deep breaths and lie down with your feet propped up, or bend forward with your head resting between your knees. You may get drowsy or dizzy. Do not drive, use machinery, or do anything that needs mental alertness until you know how this drug affects you. Do not stand or sit up quickly, especially if you are an older patient. This reduces the risk of dizzy or fainting spells. Alcohol can make you more drowsy and dizzy. Avoid alcoholic drinks. Do not treat yourself for coughs, colds, or pain while you are taking this medicine without asking your doctor or health care professional for advice. Some ingredients may increase your blood pressure. What side effects may I notice from receiving this medicine? Side effects that you should report to your doctor or health care professional as soon as possible: -blurred vision -dry mouth -skin rash -sweating -the feeling of extreme pressure in the head -unusually weak or tired Side effects that usually do not require medical attention (report to your doctor or health care professional if they continue or are bothersome): -flushing of the face or neck -headache -irregular heartbeat, palpitations -nausea, vomiting This list may not describe all possible side effects. Call your doctor for medical advice about side effects. You may report side effects to FDA at 1-800-FDA-1088. Where should I keep my medicine? Keep out of the reach of children. Store at room temperature between 20 and 25 degrees C (68 and 77 degrees F). Store in Retail buyeroriginal container. Protect from light and moisture. Keep tightly closed. Throw away any unused medicine after the expiration date. NOTE: This sheet is a summary. It may not cover all possible information. If you have questions about this medicine, talk to your doctor, pharmacist, or health care provider.  2018 Elsevier/Gold Standard (2013-06-15 17:57:36)

## 2017-07-09 ENCOUNTER — Encounter: Payer: Self-pay | Admitting: Interventional Cardiology

## 2017-07-17 NOTE — Progress Notes (Signed)
Cardiology Office Note   Date:  07/20/2017   ID:  Selena Vega, DOB 07/11/1966, MRN [id number]  PCP:  Maurice SmallGriffin, Elaine, MD    No chief complaint on file.    Wt Readings from Last 3 Encounters:  07/20/17 154 lb 3.2 oz (69.9 kg)  07/08/17 149 lb 12.8 oz (67.9 kg)  04/19/17 149 lb (67.6 kg)       History of Present Illness: Selena Vega is a 51 y.o. female  with h/o MI. She was admitted in August of this year for chest pain. Troponins were cycled and significantly elevated, peaking at 32.52. She underwent LCH per Dr. Tresa EndoKelly and was found to have a mid LAD lesion, 40 %stenosed.The left ventricular ejection fraction is 45-50% by visual estimate.LV end diastolic pressurewas mildly elevated.LV gram showed mild acute LV dysfunction with hypokinesis involving the mid distal anterolateral wall and apex.  She was noted to have single vessel nonobstructive CAD with a sharp angle in the mid LAD segment, which may be a site of potential transient occlusion resulting from vasospasm. There didnot appear to be an angiographic spontaneous dissection, but the LAD distally wasof smaller caliber than proximally. No false lumen wasvisualized. There was slight improvement following intracoronary nitroglycerin, but there still remained a residual 30-40% narrowing immediately beyond the sharp bend in the vessel. She was placed on DAPT with ASA and Plavix. She was also started on amlodipine and Lipitor. She had a repeat echo which showed improvement in LVEF back to normal range at 60-65%.  She has had some MSK chest pain in the past.   She was given prn Lasix for fluid retention, which she uses rarely.  She started atorvastatin 40 mg after 06/24/17 labs.    Angina was back pain.    Denies : Chest pain. Dizziness. Leg edema. Nitroglycerin use. Orthopnea. Palpitations. Paroxysmal nocturnal dyspnea. Shortness of breath. Syncope.   She now walks several days a week.  No  symptoms with exertion.  Overall, she feels well.  She is back to a full work schedule.  She is traveling.  She has not had any symptoms that reminded her of her MI several months ago.    History reviewed. No pertinent past medical history.  Past Surgical History:  Procedure Laterality Date  . CESAREAN SECTION    . Left Heart Cath and Coronary Angiography N/A 03/31/2017   Performed by Lennette BihariKelly, Thomas A[facility]     Current Outpatient Medications  Medication Sig Dispense Refill  . aspirin 81 MG chewable tablet Chew 1 tablet (81 mg total) daily by mouth. 90 tablet 3  . clopidogrel (PLAVIX) 75 MG tablet Take 1 tablet (75 mg total) by mouth daily. 90 tablet 3  . furosemide (LASIX) 40 MG tablet Take 40 mg daily as needed by mouth.    . nitroGLYCERIN (NITROSTAT) 0.4 MG SL tablet Place 1 tablet (0.4 mg total) every 5 (five) minutes as needed under the tongue. 25 tablet 3  . omeprazole (PRILOSEC) 40 MG capsule Take 1 capsule (40 mg total) by mouth daily. 30 capsule 1  . potassium chloride SA (K-DUR,KLOR-CON) 20 MEQ tablet Take 20 mEq as needed by mouth.    . tetrahydrozoline-zinc (VISINE-AC) 0.05-0.25 % ophthalmic solution Place 2 drops into both eyes 3 (three) times daily as needed.    Marland Kitchen. amLODipine (NORVASC) 5 MG tablet Take 1 tablet (5 mg total) by mouth daily. 90 tablet 3   No current facility-administered medications for  this visit.     Allergies:   Sulfa antibiotics    Social History:  The patient  reports that  has never smoked. she has never used smokeless tobacco. She reports that she drinks alcohol. She reports that she does not use drugs.   Family History:  The patient's family history includes Heart disease in her sister; Hypothyroidism in her sister; Liver cancer in her mother.    ROS:  Please see the history of present illness.   Otherwise, review of systems are positive for occasional fatigue.   All other systems are reviewed and negative.    PHYSICAL  EXAM: VS:  BP 117/82   Pulse 80   Ht 5\' 7"  (1.702 m)   Wt 154 lb 3.2 oz (69.9 kg)   SpO2 98%   BMI 24.15 kg/m  , BMI Body mass index is 24.15 kg/m. GEN: Well nourished, well developed, in no acute distress  HEENT: normal  Neck: no JVD, carotid bruits, or masses Cardiac: RRR; no murmurs, rubs, or gallops,no edema  Respiratory:  clear to auscultation bilaterally, normal work of breathing GI: soft, nontender, nondistended, + BS MS: no deformity or atrophy  Skin: warm and dry, no rash Neuro:  Strength and sensation are intact Psych: euthymic mood, full affect     Recent Labs: 03/31/2017: Magnesium 2.0 04/01/2017: Hemoglobin 13.2; Platelets 220 05/07/2017: BUN 10; Creatinine, Ser 0.58; NT-Pro BNP 589; Potassium 4.4; Sodium 141 06/24/2017: ALT 19   Lipid Panel    Component Value Date/Time   CHOL 203 (H) 06/24/2017 1159   TRIG 142 06/24/2017 1159   HDL 69 06/24/2017 1159   CHOLHDL 2.9 06/24/2017 1159   CHOLHDL 2.4 03/31/2017 0748   VLDL 20 03/31/2017 0748   LDLCALC 106 (H) 06/24/2017 1159     Other studies Reviewed: Additional studies/ records that were reviewed today with results demonstrating: Lab work reviewed; we also went over her catheterization results again.  October 2018 echo reviewed.  Ejection fraction now in the normal range.   ASSESSMENT AND PLAN:  1. CAD : No angina on current medical therapy.  Continue current medicines. 2. Hyperlipidemia: LDL 106.  She has started atorvastatin.  Will recheck labs in a few months. 3. Blood pressure well controlled.  She was started on amlodipine not for high blood pressure but for possible coronary vasospasm.  No symptoms of ischemia.   Current medicines are reviewed at length with the patient today.  The patient concerns regarding her medicines were addressed.  The following changes have been made:  No change  Labs/ tests ordered today include:  No orders of the defined types were placed in this encounter.   Recommend  150 minutes/week of aerobic exercise Low fat, low carb, high fiber diet recommended  Disposition:   FU in 6 months   Signed, Lance MussJayadeep Niamh Rada, MD  07/20/2017 8:41 AM    [facility] [address], [place], [place]  [address] Phone: [phone]; Fax: [phone]

## 2017-07-20 ENCOUNTER — Ambulatory Visit: Payer: BLUE CROSS/BLUE SHIELD | Admitting: Interventional Cardiology

## 2017-07-20 ENCOUNTER — Encounter: Payer: Self-pay | Admitting: Interventional Cardiology

## 2017-07-20 ENCOUNTER — Ambulatory Visit (INDEPENDENT_AMBULATORY_CARE_PROVIDER_SITE_OTHER): Payer: BLUE CROSS/BLUE SHIELD | Admitting: Interventional Cardiology

## 2017-07-20 VITALS — BP 117/82 | HR 80 | Ht 67.0 in | Wt 154.2 lb

## 2017-07-20 DIAGNOSIS — I252 Old myocardial infarction: Secondary | ICD-10-CM

## 2017-07-20 DIAGNOSIS — I25119 Atherosclerotic heart disease of native coronary artery with unspecified angina pectoris: Secondary | ICD-10-CM | POA: Diagnosis not present

## 2017-07-20 DIAGNOSIS — E785 Hyperlipidemia, unspecified: Secondary | ICD-10-CM

## 2017-07-20 NOTE — Patient Instructions (Signed)

## 2017-07-26 DIAGNOSIS — J069 Acute upper respiratory infection, unspecified: Secondary | ICD-10-CM | POA: Diagnosis not present

## 2017-09-02 ENCOUNTER — Other Ambulatory Visit: Payer: BLUE CROSS/BLUE SHIELD

## 2017-09-03 ENCOUNTER — Other Ambulatory Visit: Payer: BLUE CROSS/BLUE SHIELD | Admitting: *Deleted

## 2017-09-03 DIAGNOSIS — E785 Hyperlipidemia, unspecified: Secondary | ICD-10-CM | POA: Diagnosis not present

## 2017-09-03 DIAGNOSIS — I214 Non-ST elevation (NSTEMI) myocardial infarction: Secondary | ICD-10-CM

## 2017-09-03 LAB — LIPID PANEL
CHOLESTEROL TOTAL: 188 mg/dL (ref 100–199)
Chol/HDL Ratio: 2.6 ratio (ref 0.0–4.4)
HDL: 73 mg/dL (ref >39)
LDL Calculated: 101 mg/dL — ABNORMAL HIGH (ref 0–99)
Triglycerides: 69 mg/dL (ref 0–149)
VLDL Cholesterol Cal: 14 mg/dL (ref 5–40)

## 2017-09-03 LAB — HEPATIC FUNCTION PANEL
ALT: 20 IU/L (ref 0–32)
AST: 25 IU/L (ref 0–40)
Albumin: 4.6 g/dL (ref 3.5–5.5)
Alkaline Phosphatase: 86 IU/L (ref 39–117)
Bilirubin Total: 0.4 mg/dL (ref 0.0–1.2)
Bilirubin, Direct: 0.1 mg/dL (ref 0.00–0.40)
Total Protein: 6.7 g/dL (ref 6.0–8.5)

## 2017-09-10 ENCOUNTER — Telehealth: Payer: Self-pay | Admitting: Cardiology

## 2017-09-10 DIAGNOSIS — E785 Hyperlipidemia, unspecified: Secondary | ICD-10-CM

## 2017-09-10 NOTE — Telephone Encounter (Signed)
New message ° °Pt verbalized that she is returning call for RN °

## 2017-09-10 NOTE — Telephone Encounter (Signed)
Left patient message regarding her medication regimen.

## 2017-09-13 NOTE — Telephone Encounter (Signed)
Spoke with patient regarding her Lipitor usage and she mentioned that she has not started it yet.  She will begin today and will return 3/18 for a fasting lipid panel and hepatic function.  She verbalized an understanding of this.

## 2017-09-23 ENCOUNTER — Other Ambulatory Visit: Payer: Self-pay | Admitting: Family Medicine

## 2017-09-23 DIAGNOSIS — Z1231 Encounter for screening mammogram for malignant neoplasm of breast: Secondary | ICD-10-CM

## 2017-10-02 DIAGNOSIS — J069 Acute upper respiratory infection, unspecified: Secondary | ICD-10-CM | POA: Diagnosis not present

## 2017-10-12 ENCOUNTER — Ambulatory Visit: Payer: BLUE CROSS/BLUE SHIELD

## 2017-10-13 DIAGNOSIS — K529 Noninfective gastroenteritis and colitis, unspecified: Secondary | ICD-10-CM | POA: Diagnosis not present

## 2017-10-13 DIAGNOSIS — R6889 Other general symptoms and signs: Secondary | ICD-10-CM | POA: Diagnosis not present

## 2017-11-02 ENCOUNTER — Ambulatory Visit
Admission: RE | Admit: 2017-11-02 | Discharge: 2017-11-02 | Disposition: A | Discharge status: Home / Self Care | Payer: BLUE CROSS/BLUE SHIELD | Source: Ambulatory Visit | Attending: Family Medicine | Admitting: Family Medicine

## 2017-11-02 ENCOUNTER — Encounter: Payer: Self-pay | Admitting: Radiology

## 2017-11-02 DIAGNOSIS — Z1231 Encounter for screening mammogram for malignant neoplasm of breast: Secondary | ICD-10-CM | POA: Diagnosis not present

## 2017-11-15 ENCOUNTER — Other Ambulatory Visit: Payer: BLUE CROSS/BLUE SHIELD

## 2017-12-01 ENCOUNTER — Other Ambulatory Visit: Payer: Self-pay | Admitting: *Deleted

## 2017-12-01 MED ORDER — OMEPRAZOLE 40 MG PO CPDR: 40.0000 mg | DELAYED_RELEASE_CAPSULE | Freq: Every day | ORAL | 1 refills | Status: DC

## 2017-12-16 ENCOUNTER — Telehealth: Payer: Self-pay | Admitting: Interventional Cardiology

## 2017-12-16 NOTE — Telephone Encounter (Signed)
New message   Patient calling to request changing physicians, declined to give reason. Patient would like to become a patient of Dr Duke Salviaandolph, currently followed by Dr Eldridge DaceVaranasi. Please advise

## 2017-12-16 NOTE — Telephone Encounter (Signed)
OK with me.

## 2017-12-17 NOTE — Telephone Encounter (Signed)
OK with me.

## 2018-01-03 DIAGNOSIS — R509 Fever, unspecified: Secondary | ICD-10-CM | POA: Diagnosis not present

## 2018-01-06 ENCOUNTER — Other Ambulatory Visit: Payer: Self-pay | Admitting: Family Medicine

## 2018-01-06 DIAGNOSIS — R6889 Other general symptoms and signs: Secondary | ICD-10-CM

## 2018-01-06 DIAGNOSIS — R509 Fever, unspecified: Secondary | ICD-10-CM

## 2018-01-07 ENCOUNTER — Ambulatory Visit
Admission: RE | Admit: 2018-01-07 | Discharge: 2018-01-07 | Disposition: A | Discharge status: Home / Self Care | Payer: BLUE CROSS/BLUE SHIELD | Source: Ambulatory Visit | Attending: Family Medicine | Admitting: Family Medicine

## 2018-01-07 DIAGNOSIS — R509 Fever, unspecified: Secondary | ICD-10-CM

## 2018-01-07 DIAGNOSIS — R6889 Other general symptoms and signs: Secondary | ICD-10-CM

## 2018-01-07 DIAGNOSIS — J329 Chronic sinusitis, unspecified: Secondary | ICD-10-CM | POA: Diagnosis not present

## 2018-01-11 ENCOUNTER — Other Ambulatory Visit: Payer: BLUE CROSS/BLUE SHIELD

## 2018-01-11 DIAGNOSIS — N390 Urinary tract infection, site not specified: Secondary | ICD-10-CM | POA: Diagnosis not present

## 2018-01-13 DIAGNOSIS — L821 Other seborrheic keratosis: Secondary | ICD-10-CM | POA: Diagnosis not present

## 2018-01-13 DIAGNOSIS — D1801 Hemangioma of skin and subcutaneous tissue: Secondary | ICD-10-CM | POA: Diagnosis not present

## 2018-01-13 DIAGNOSIS — L814 Other melanin hyperpigmentation: Secondary | ICD-10-CM | POA: Diagnosis not present

## 2018-01-13 DIAGNOSIS — D225 Melanocytic nevi of trunk: Secondary | ICD-10-CM | POA: Diagnosis not present

## 2018-02-07 ENCOUNTER — Telehealth: Payer: Self-pay | Admitting: Interventional Cardiology

## 2018-02-07 NOTE — Telephone Encounter (Signed)
Pt's pharmacy is requesting a refill on omeprazole. Would Dr. Eldridge DaceVaranasi like to refill this medication? Please address

## 2018-02-08 DIAGNOSIS — R350 Frequency of micturition: Secondary | ICD-10-CM | POA: Diagnosis not present

## 2018-02-08 DIAGNOSIS — N10 Acute tubulo-interstitial nephritis: Secondary | ICD-10-CM | POA: Diagnosis not present

## 2018-02-09 ENCOUNTER — Other Ambulatory Visit: Payer: Self-pay

## 2018-02-09 MED ORDER — OMEPRAZOLE 40 MG PO CPDR: 40.0000 mg | DELAYED_RELEASE_CAPSULE | Freq: Every day | ORAL | 0 refills | Status: DC

## 2018-02-10 NOTE — Progress Notes (Signed)
Cardiology Office Note   Date:  02/14/2018   ID:  Selena Vega, DOB 07/24/1966, MRN [id number]  PCP:  Selena SmallGriffin, Elaine, MD  Cardiologist:   Selena Siiffany Wade Hampton, MD   Chief Complaint  Patient presents with  . New Patient (Initial Visit)    Pt states no Sx.       History of Present Illness: Selena Vega is Vega 52 y.o. female with CAD s/p NSTEMI 03/2017 who presents for follow up.  She was previously Vega patient of Dr. Eldridge Vega.  She presented with NSTEMI (troponin 37) 05/2017 and was found to have Vega 40% LAD lesion with LVEF 45-50%.  There was concern for vasospasm in the mid LAD at an area of sharp bend in the vessel.  After IC nitroglycerin the stenosis improved to 30-40%.  She was treated with ASA and clopidogrel, amlodipine and atorvastatin.  Repeat echo revealed LVEF 60-65%.  She las saw Dr. Eldridge Vega 07/2017 and reported recurrent angina.   she states that overall she has been feeling okay.  She walks for exercise 3 days/week for 45 minutes and has no exertional symptoms.  She sometimes have shortness of breath that occurs randomly epecially when laying down. She has no lower extremity edema.   she had an echo 05/2017 that revealed normal systolic function and diastolic function.  Her IVC was not dilated.  She was given Vega diuretic but this did not help her symptoms so she discontinued it.  She was frustrated that she was being treated for coronary artery disease though she does not think she actually has true coronary artery disease.  She stopped taking clopidogrel, omeprazole, and atorvastatin.  She had muscle aches on atorvastatin.  She has not smoked in over 20 years.   Past Medical History:  Diagnosis Date  . Coronary vasospasm (HCC) 02/14/2018    Past Surgical History:  Procedure Laterality Date  . CESAREAN SECTION    . LEFT HEART CATH AND CORONARY ANGIOGRAPHY N/Vega 03/31/2017   Procedure: Left Heart Cath and Coronary Angiography;  Surgeon: Selena BihariKelly, Selena A, MD;   [facility];  Service: Cardiovascular;  Laterality: N/Vega;     Current Outpatient Medications  Medication Sig Dispense Refill  . amLODipine (NORVASC) 5 MG tablet Take 1 tablet (5 mg total) by mouth daily. 90 tablet 3  . aspirin 81 MG chewable tablet Chew 1 tablet (81 mg total) daily by mouth. 90 tablet 3  . atorvastatin (LIPITOR) 20 MG tablet Take 1 tablet (20 mg total) by mouth daily. 90 tablet 1   No current facility-administered medications for this visit.     Allergies:   Sulfa antibiotics    Social History:  The patient  reports that she has never smoked. She has never used smokeless tobacco. She reports that she drinks alcohol. She reports that she does not use drugs.   Family History:  The patient's family history includes Heart disease in her sister; Hypothyroidism in her sister; Liver cancer in her mother.    ROS:  Please see the history of present illness.   Otherwise, review of systems are positive for none.   All other systems are reviewed and negative.    PHYSICAL EXAM: VS:  BP 135/85   Pulse 76   Ht 5\' 6"  (1.676 m)   Wt 143 lb 6.4 oz (65 kg)   LMP 03/25/2014 Comment: husband has vasectomy  BMI 23.15 kg/m  , BMI Body mass index is 23.15 kg/m. GENERAL:  Well appearing  HEENT:  Pupils equal round and reactive, fundi not visualized, oral mucosa unremarkable NECK:  No jugular venous distention, waveform within normal limits, carotid upstroke brisk and symmetric, no bruits, no thyromegaly LYMPHATICS:  No cervical adenopathy LUNGS:  Clear to auscultation bilaterally HEART:  RRR.  PMI not displaced or sustained,S1 and S2 within normal limits, no S3, no S4, no clicks, no rubs, no murmurs ABD:  Flat, positive bowel sounds normal in frequency in pitch, no bruits, no rebound, no guarding, no midline pulsatile mass, no hepatomegaly, no splenomegaly EXT:  2 plus pulses throughout, no edema, no cyanosis no clubbing SKIN:  No rashes no nodules NEURO:  Cranial  nerves II through XII grossly intact, motor grossly intact throughout PSYCH:  Cognitively intact, oriented to person place and time   EKG:  EKG is not ordered today.    Recent Labs: 03/31/2017: Magnesium 2.0 04/01/2017: Hemoglobin 13.2; Platelets 220 05/07/2017: BUN 10; Creatinine, Ser 0.58; NT-Pro BNP 589; Potassium 4.4; Sodium 141 09/03/2017: ALT 20    Lipid Panel    Component Value Date/Time   CHOL 188 09/03/2017 0804   TRIG 69 09/03/2017 0804   HDL 73 09/03/2017 0804   CHOLHDL 2.6 09/03/2017 0804   CHOLHDL 2.4 03/31/2017 0748   VLDL 20 03/31/2017 0748   LDLCALC 101 (H) 09/03/2017 0804      Wt Readings from Last 3 Encounters:  02/14/18 143 lb 6.4 oz (65 kg)  07/20/17 154 lb 3.2 oz (69.9 kg)  07/08/17 149 lb 12.8 oz (67.9 kg)      ASSESSMENT AND PLAN:  # Coronary vasospasm: # Hyperlipidemia: # Non-obstructive CAD: [name] is not having symptoms at this time.  We discussed the fact that with Vega troponin of 37 and anterior TWI on EKG and only mild CAD, coronary spasm is the likely diagnosis and that she has been managed appropriately.  She appreciated the explanation and is willing to take cardiac meds to limit the likelihood of progression of her CAD.  She will resume atorvastatin at 20mg  qhs.  She had myalgias at higher doses and her LDL was 100 off medication.  Repeat lipids and CMP in 6 to 8 weeks.  If she is not at goal or if she has recurrent symptoms we will consider trying another statin.  Continue aspirin.  She will discontinue clopidogrel.  Continue amlodipine.  # Shortness of breath: Symptoms occur randomly.She did not improve with the diuretic and there is no evidence of heart failure on exam or by echo.  Okay to discontinue diuretic at this time.  She has not been taking it for quite Vega while.     Current medicines are reviewed at length with the patient today.  The patient does not have concerns regarding medicines.  The following changes have been made:   Stop omeprazole.  Start atorvastatin 20mg  daily.   Labs/ tests ordered today include:  Orders Placed This Encounter  Procedures  . Lipid panel  . Comprehensive metabolic panel     Disposition:   FU with Mercia Dowe C. Duke Salvia, MD, [facility] in 4 months.   Time spent: 45 minutes-Greater than 50% of this time was spent in counseling, explanation of diagnosis, planning of further management, and coordination of care.    Signed, Renner Sebald C. Duke Salvia, MD, [facility]  02/14/2018 8:44 AM    [facility]

## 2018-02-14 ENCOUNTER — Encounter: Payer: Self-pay | Admitting: Cardiovascular Disease

## 2018-02-14 ENCOUNTER — Ambulatory Visit: Payer: BLUE CROSS/BLUE SHIELD | Admitting: Cardiovascular Disease

## 2018-02-14 VITALS — BP 135/85 | HR 76 | Ht 66.0 in | Wt 143.4 lb

## 2018-02-14 DIAGNOSIS — I201 Angina pectoris with documented spasm: Secondary | ICD-10-CM

## 2018-02-14 DIAGNOSIS — Z5181 Encounter for therapeutic drug level monitoring: Secondary | ICD-10-CM

## 2018-02-14 DIAGNOSIS — E785 Hyperlipidemia, unspecified: Secondary | ICD-10-CM | POA: Diagnosis not present

## 2018-02-14 HISTORY — DX: Angina pectoris with documented spasm: I20.1

## 2018-02-14 MED ORDER — ATORVASTATIN CALCIUM 20 MG PO TABS: 20.0000 mg | ORAL_TABLET | Freq: Every day | ORAL | 1 refills | Status: DC

## 2018-02-14 NOTE — Patient Instructions (Signed)
Medication Instructions:  STOP OMEPRAZOLE   START ATORVASTATIN 20 MG DAILY   Labwork: FASTING LP/CMET IN 6-8 WEEKS   Testing/Procedures: NONE  Follow-Up: Your physician recommends that you schedule a follow-up appointment in: 4 MONTHS  If you need a refill on your cardiac medications before your next appointment, please call your pharmacy.

## 2018-02-16 ENCOUNTER — Telehealth: Payer: Self-pay | Admitting: Cardiovascular Disease

## 2018-02-16 NOTE — Telephone Encounter (Signed)
HOLD atorvastatin until Monday, take benadryl 25mg . Visit urgent care if allergic reaction worsen.   Okay to re-challenge with atorvastatin next week if skin reaction completely gone.

## 2018-02-16 NOTE — Telephone Encounter (Signed)
Patient made aware of recommendations. She will call next week with an update.

## 2018-02-16 NOTE — Telephone Encounter (Signed)
Returned the call to the patient. She stated that she started Atorvastatin on Monday evening. By Tuesday morning she had developed whelps on her torso and behind her ear. She is also currently taking Macrobid for a UTI. This morning was her last dose. She denied shortness of breath and has not taken benadryl for the whelps. She has not tried any new foods either that would cause the whelps. The patient is concerned that it may be a reaction with the two medications together but today was her last day of the antibiotic.  Message routed to pharmd for their recommendation.

## 2018-02-16 NOTE — Telephone Encounter (Signed)
New Message   Pt c/o medication issue:  1. Name of Medication: atorvastatin (LIPITOR) 20 MG tablet and an antibiotic for a urinary tract infections  2. How are you currently taking this medication (dosage and times per day)? Take 1 tablet (20 mg total) by mouth daily  3. Are you having a reaction (difficulty breathing--STAT)? Yes sob and whelps all over  4. What is your medication issue? Pt states she thinks she is having an allergic creation to the medication Duke SalviaRandolph prescribed but says she also for got to tell Duke SalviaRandolph that she is on an Anti biotic please call

## 2018-02-22 DIAGNOSIS — Z0000 Encounter for general adult medical examination without abnormal findings: Secondary | ICD-10-CM | POA: Diagnosis not present

## 2018-02-22 DIAGNOSIS — N3090 Cystitis, unspecified without hematuria: Secondary | ICD-10-CM | POA: Diagnosis not present

## 2018-02-25 ENCOUNTER — Ambulatory Visit: Payer: BLUE CROSS/BLUE SHIELD | Admitting: Cardiovascular Disease

## 2018-02-25 ENCOUNTER — Encounter

## 2018-03-28 DIAGNOSIS — Z5181 Encounter for therapeutic drug level monitoring: Secondary | ICD-10-CM | POA: Diagnosis not present

## 2018-03-28 DIAGNOSIS — E785 Hyperlipidemia, unspecified: Secondary | ICD-10-CM | POA: Diagnosis not present

## 2018-03-28 LAB — LIPID PANEL
CHOLESTEROL TOTAL: 151 mg/dL (ref 100–199)
Chol/HDL Ratio: 1.6 ratio (ref 0.0–4.4)
HDL: 94 mg/dL (ref >39)
LDL CALC: 43 mg/dL (ref 0–99)
TRIGLYCERIDES: 71 mg/dL (ref 0–149)
VLDL Cholesterol Cal: 14 mg/dL (ref 5–40)

## 2018-03-28 LAB — COMPREHENSIVE METABOLIC PANEL
ALT: 30 IU/L (ref 0–32)
AST: 29 IU/L (ref 0–40)
Albumin/Globulin Ratio: 2.1 (ref 1.2–2.2)
Albumin: 4.4 g/dL (ref 3.5–5.5)
Alkaline Phosphatase: 103 IU/L (ref 39–117)
BUN/Creatinine Ratio: 14 (ref 9–23)
BUN: 9 mg/dL (ref 6–24)
Bilirubin Total: 1.1 mg/dL (ref 0.0–1.2)
CALCIUM: 9.4 mg/dL (ref 8.7–10.2)
CHLORIDE: 101 mmol/L (ref 96–106)
CO2: 23 mmol/L (ref 20–29)
Creatinine, Ser: 0.63 mg/dL (ref 0.57–1.00)
GFR, EST AFRICAN AMERICAN: 118 mL/min/{1.73_m2} (ref >59)
GFR, EST NON AFRICAN AMERICAN: 103 mL/min/{1.73_m2} (ref >59)
GLUCOSE: 84 mg/dL (ref 65–99)
Globulin, Total: 2.1 g/dL (ref 1.5–4.5)
Potassium: 4.5 mmol/L (ref 3.5–5.2)
Sodium: 139 mmol/L (ref 134–144)
Total Protein: 6.5 g/dL (ref 6.0–8.5)

## 2018-04-04 ENCOUNTER — Encounter (HOSPITAL_COMMUNITY): Payer: Self-pay

## 2018-04-04 ENCOUNTER — Other Ambulatory Visit: Payer: Self-pay

## 2018-04-04 ENCOUNTER — Telehealth: Payer: Self-pay | Admitting: Cardiovascular Disease

## 2018-04-04 ENCOUNTER — Emergency Department (HOSPITAL_COMMUNITY)
Admission: EM | Admit: 2018-04-04 | Discharge: 2018-04-04 | Disposition: A | Discharge status: Home / Self Care | Payer: BLUE CROSS/BLUE SHIELD | Attending: Emergency Medicine | Admitting: Emergency Medicine

## 2018-04-04 DIAGNOSIS — Z79899 Other long term (current) drug therapy: Secondary | ICD-10-CM

## 2018-04-04 DIAGNOSIS — R220 Localized swelling, mass and lump, head: Secondary | ICD-10-CM | POA: Diagnosis not present

## 2018-04-04 DIAGNOSIS — Z7982 Long term (current) use of aspirin: Secondary | ICD-10-CM | POA: Insufficient documentation

## 2018-04-04 DIAGNOSIS — T7840XA Allergy, unspecified, initial encounter: Secondary | ICD-10-CM | POA: Diagnosis not present

## 2018-04-04 DIAGNOSIS — Z91013 Allergy to seafood: Secondary | ICD-10-CM | POA: Diagnosis not present

## 2018-04-04 MED ORDER — PREDNISONE 20 MG PO TABS: 40.0000 mg | ORAL_TABLET | Freq: Every day | ORAL | 0 refills | Status: DC

## 2018-04-04 MED ORDER — EPINEPHRINE 0.3 MG/0.3ML IJ SOAJ: 0.3000 mg | Freq: Once | INTRAMUSCULAR | 0 refills | Status: AC

## 2018-04-04 MED ORDER — DIPHENHYDRAMINE HCL 50 MG/ML IJ SOLN
25.0000 mg | Freq: Once | INTRAMUSCULAR | Status: AC
  Administered 2018-04-04: 25 mg via INTRAVENOUS
  Filled 2018-04-04: qty 1

## 2018-04-04 MED ORDER — METHYLPREDNISOLONE SODIUM SUCC 125 MG IJ SOLR
125.0000 mg | Freq: Once | INTRAMUSCULAR | Status: AC
  Administered 2018-04-04: 125 mg via INTRAVENOUS
  Filled 2018-04-04: qty 2

## 2018-04-04 NOTE — Telephone Encounter (Signed)
Pt called to report that she has restarted her Lipitor after breaking out in hives when starting it for the first time but she was also on Macrodantin at the time. After ending her round of the macrodantin she restarted Lipitor and at 7am this morning she had facial and mouth edema and treated in the ER. She also reports eating seafood (mussels, crab dip, and tuna) last night at 4pm but has eaten these things for many years without difficulty. ER physician has stopped her Lipitor and pt calling to let Dr. Duke Salviaandolph know and to see what further recommendations she  may have. Afvised pt that I will send message to Dr. Duke Salviaandolph and will get back in touch with her after her review. Pt agrees.

## 2018-04-04 NOTE — ED Provider Notes (Signed)
[facility]-EMERGENCY DEPT Provider Note   CSN: [id number] Arrival date & time: 04/04/18  0736     History   Chief Complaint Chief Complaint  Patient presents with  . Allergic Reaction    HPI Selena Vega is a 52 y.o. female who presents with facial swelling.  Past medical history significant for coronary vasospasm.  She states that when she woke up at around 7:00 this morning she noticed that her lip was a little bit swollen and over the past subsequent 30 minutes she had rapidly progressive left-sided facial swelling and feeling like her throat was swollen as well.  She states that she ate a lot of seafood last night but states that she eats this every day without any problems.  She is on several blood pressure medicines but is not on an ACE inhibitor.  She is concerned she may have had a medication reaction as she had a possible reaction to an antibiotic a couple months ago (hives). She denies lightheadedness, syncope, hives, shortness of breath or wheezing, abdominal cramping, nausea or vomiting.  She has never had this before.  HPI  Past Medical History:  Diagnosis Date  . Coronary vasospasm (HCC) 02/14/2018    Patient Active Problem List   Diagnosis Date Noted  . Coronary vasospasm (HCC) 02/14/2018  . Decreased left ventricular systolic function   . Chest pain 03/31/2017  . Hypokalemia 03/31/2017  . Elevated troponin 03/31/2017  . NSTEMI (non-ST elevated myocardial infarction) (HCC) 03/31/2017    Past Surgical History:  Procedure Laterality Date  . CESAREAN SECTION    . LEFT HEART CATH AND CORONARY ANGIOGRAPHY N/A 03/31/2017   Procedure: Left Heart Cath and Coronary Angiography;  Surgeon: Lennette Bihari, MD;  Location: [facility] INVASIVE CV LAB;  Service: Cardiovascular;  Laterality: N/A;     OB History   None      Home Medications    Prior to Admission medications   Medication Sig Start Date End Date Taking? Authorizing Provider    amLODipine (NORVASC) 5 MG tablet Take 1 tablet (5 mg total) by mouth daily. 04/19/17 02/14/18  Allayne Butcher, PA-C  aspirin 81 MG chewable tablet Chew 1 tablet (81 mg total) daily by mouth. 07/05/17   Corky Crafts, MD  atorvastatin (LIPITOR) 20 MG tablet Take 1 tablet (20 mg total) by mouth daily. 02/14/18 05/15/18  Chilton Si, MD    Family History Family History  Problem Relation Age of Onset  . Liver cancer Mother   . Heart disease Sister   . Hypothyroidism Sister     Social History Social History   Tobacco Use  . Smoking status: Never Smoker  . Smokeless tobacco: Never Used  Substance Use Topics  . Alcohol use: Yes    Comment: wine daily  . Drug use: No     Allergies   Sulfa antibiotics   Review of Systems Review of Systems  HENT: Positive for facial swelling.        +throat swelling  Respiratory: Negative for shortness of breath and wheezing.   Gastrointestinal: Negative for abdominal pain, nausea and vomiting.  Skin: Negative for rash.  Neurological: Negative for syncope and light-headedness.  All other systems reviewed and are negative.    Physical Exam Updated Vital Signs BP (!) 154/114 (BP Location: Left Arm)   Pulse 85   Temp 97.9 F (36.6 C) (Oral)   Resp 14   Ht 5\' 7"  (1.702 m)   Wt 63.5 kg (140 lb)  LMP 03/25/2014 Comment: husband has vasectomy  SpO2 100%   BMI 21.93 kg/m   Physical Exam  Constitutional: She is oriented to person, place, and time. She appears well-developed and well-nourished. No distress.  Calm, cooperative.  No acute distress and speaking in full sentences. She is tolerating secretions. Left side of the face is obviously swollen.    HENT:  Head: Normocephalic and atraumatic.  No oropharyngeal edema  Eyes: Pupils are equal, round, and reactive to light. Conjunctivae are normal. Right eye exhibits no discharge. Left eye exhibits no discharge. No scleral icterus.  Neck: Normal range of motion.   Cardiovascular: Normal rate and regular rhythm.  Pulmonary/Chest: Effort normal and breath sounds normal. No respiratory distress.  Abdominal: She exhibits no distension.  Neurological: She is alert and oriented to person, place, and time.  Skin: Skin is warm and dry. Rash (small area of macular erythema with irregular borders over the left forearm ) noted.  Psychiatric: She has a normal mood and affect. Her behavior is normal.  Nursing note and vitals reviewed.    ED Treatments / Results  Labs (all labs ordered are listed, but only abnormal results are displayed) Labs Reviewed - No data to display  EKG None  Radiology No results found.  Procedures Procedures (including critical care time)  Medications Ordered in ED Medications  methylPREDNISolone sodium succinate (SOLU-MEDROL) 125 mg/2 mL injection 125 mg (125 mg Intravenous Given 04/04/18 0803)  diphenhydrAMINE (BENADRYL) injection 25 mg (25 mg Intravenous Given 04/04/18 0803)     Initial Impression / Assessment and Plan / ED Course  I have reviewed the triage vital signs and the nursing notes.  Pertinent labs & imaging results that were available during my care of the patient were reviewed by me and considered in my medical decision making (see chart for details).  52 year old female with acute onset of facial swelling and possible hives. Her vitals are normal. She is not in distress and is able to talk normally. Lungs are CTA. She does have marked swelling only on the left side and has a slight rash on the left forearm. Will give IV solu-medrol and benadryl.  8:58 AM Rechecked pt. She feels a little better. Face is still swollen. She did develop a rash on her arm and leg per nursing staff and this improved after Benadryl was given.  10:13 AM Shared visit with Dr. Effie ShyWentz. Pt has remained stable and is improving. Will d/c with steroid burst and recommended antihistamines prn. Epi-pen rx was provided. PCP f/u recommended and  strict return precautions given.   Final Clinical Impressions(s) / ED Diagnoses   Final diagnoses:  Allergic reaction, initial encounter  Left facial swelling    ED Discharge Orders    None       Bethel BornGekas, Bernyce Brimley Marie, PA-C 04/04/18 1014    Mancel BaleWentz, Elliott, MD 04/04/18 [phone]

## 2018-04-04 NOTE — Telephone Encounter (Signed)
New Message        Pt c/o medication issue: 1. Name of Medication: Lipitor 2. How are you currently taking this medication (dosage and times per day)? daily 3. Are you having a reaction (difficulty breathing--STAT)?  No 4. What is your medication issue? Allergic reaction/one side of face was swollen/hives. Patient went to ER. Patient states she was told it could be the Lipitor (generic brand)

## 2018-04-04 NOTE — Telephone Encounter (Signed)
Agree with holding atorvastatin and considering it an allergy.  This is a pretty rare reaction to atorvastatin and not sure if we should avoid all statins.  Any thoughts Selena Vega?  Should we skip to PCSK9?

## 2018-04-04 NOTE — Discharge Instructions (Signed)
Please take steroid once daily for the next 4 days. You do not have to repeat a dose today Take an antihistamine as directed for itching/hives (Benadryl, Zyrtec, Claritin, etc) Use epi-pen if you develop worsening swelling, rash, vomiting, or shortness of breath/wheezing Follow up with your doctor for possible allergy testing/medication adjustements Return if you are worsening

## 2018-04-04 NOTE — ED Provider Notes (Signed)
  Face-to-face evaluation   History: She reports onset of swelling left face and rash left arm, today.  She got nervous and began to panic, but did not have difficulty breathing.  Prior episode 2 months ago shortly after starting Lipitor and taking Macrodantin.  She continues to take Lipitor.  She denies chest pain, or dizziness.  Physical exam: Alert, calm, cooperative.  No respiratory distress.  Mild angioedema left face and left upper lip.  Very minimal red rash left arm and left leg.   Medical screening examination/treatment/procedure(s) were conducted as a shared visit with non-physician practitioner(s) and myself.  I personally evaluated the patient during the encounter    Mancel BaleWentz, Braidyn Peace, MD 04/04/18 [phone]

## 2018-04-04 NOTE — ED Triage Notes (Signed)
Pt's left side of her face is swollen. Pt concerned for allergic reaction. Pt staets she ate a lot of seafood last night. No new soaps, detergents, makeup,etc. Pt concerned her throat is swelling. Pt able to speak in full sentences.

## 2018-04-05 NOTE — Telephone Encounter (Signed)
F/u    Pt calling and wanting to know status of previous message from yesterday. Waiting on call back from nurse

## 2018-04-05 NOTE — Telephone Encounter (Signed)
Spoke with the pt and advised her that we are still working on a plan to treat her CAD/Lipids.. Dr. Duke Salviaandolph [facility] and will call her back ASAP. Pt is continuing to hold her Lipitor.

## 2018-04-05 NOTE — Telephone Encounter (Signed)
Left message for pt to call back  °

## 2018-04-05 NOTE — Telephone Encounter (Signed)
Pt advised of Dr. Duke Salviaandolph and Pharm D's recommendation. Pt verbalized understanding. Lab orders placed.

## 2018-04-05 NOTE — Telephone Encounter (Signed)
I would suggest that we just go to PCSK-9i.   While the anaphylactic-type reaction is rare,  the ED felt it necessary to give her an Epi-pen.  Her LDL is only 43, so we would have to wait 6 weeks or so to repeat lipids.  Insurance requires LDL be > 70 for approval of Repatha.  She has Nurse, learning disabilitycommercial insurance, so copay would be $5.  Have her do lipids in 6 weeks and follow up in CVRR about a week after that.

## 2018-04-23 ENCOUNTER — Emergency Department (HOSPITAL_COMMUNITY)
Admission: EM | Admit: 2018-04-23 | Discharge: 2018-04-23 | Disposition: A | Discharge status: Home / Self Care | Payer: BLUE CROSS/BLUE SHIELD | Attending: Emergency Medicine | Admitting: Emergency Medicine

## 2018-04-23 ENCOUNTER — Encounter (HOSPITAL_COMMUNITY): Payer: Self-pay | Admitting: *Deleted

## 2018-04-23 ENCOUNTER — Other Ambulatory Visit: Payer: Self-pay

## 2018-04-23 DIAGNOSIS — Z7982 Long term (current) use of aspirin: Secondary | ICD-10-CM | POA: Diagnosis not present

## 2018-04-23 DIAGNOSIS — T7840XA Allergy, unspecified, initial encounter: Secondary | ICD-10-CM | POA: Diagnosis not present

## 2018-04-23 DIAGNOSIS — R21 Rash and other nonspecific skin eruption: Secondary | ICD-10-CM | POA: Diagnosis not present

## 2018-04-23 DIAGNOSIS — I252 Old myocardial infarction: Secondary | ICD-10-CM | POA: Diagnosis not present

## 2018-04-23 DIAGNOSIS — T781XXA Other adverse food reactions, not elsewhere classified, initial encounter: Secondary | ICD-10-CM | POA: Diagnosis not present

## 2018-04-23 DIAGNOSIS — R220 Localized swelling, mass and lump, head: Secondary | ICD-10-CM | POA: Diagnosis not present

## 2018-04-23 MED ORDER — PREDNISONE 20 MG PO TABS
60.0000 mg | ORAL_TABLET | Freq: Once | ORAL | Status: AC
  Administered 2018-04-23: 60 mg via ORAL
  Filled 2018-04-23: qty 3

## 2018-04-23 MED ORDER — PREDNISONE 10 MG (21) PO TBPK: ORAL_TABLET | Freq: Every day | ORAL | 0 refills | Status: DC

## 2018-04-23 NOTE — ED Triage Notes (Signed)
Pt reports her right sided facial swelling that she noticed this morning (8:15am) while she was on the plane.  Pt reported getting whelps along the crease of her buttocks.  Pt reports taking 25mg  benedryl at about 9am.  Pt reports the swelling has not improved.  Pt's voice is clear in triage and sounds like her baseline per pt's SO. Pt has had similar symptoms in the past but has not figured out what she is allergic to.  Pt does have an appt with an allergist but hasn't gone to it yet. Pt a/o x 4 and ambulatory in triage.

## 2018-04-23 NOTE — ED Provider Notes (Signed)
[facility]-EMERGENCY DEPT Provider Note   CSN: [id number] Arrival date & time: 04/23/18  1411     History   Chief Complaint Chief Complaint  Patient presents with  . Facial Swelling    HPI Selena Vega is a 52 y.o. female with a past medical history of NSTEMI 2/2 coronary vasospasm, who presents to ED for evaluation of R lower lip swelling that began at approximate 7:30am this morning. It is now 3:30pm. [place] so she flew into [place]. Because of this, she was unable to eat anything for dinner last night with the exception of "about 20 of those peanut butter crackers." She woke up this morning seeing that her R lower lip was swollen after feeling several hives on her buttocks. States that this is usually how her symptoms begin. This has happened to her about 5 times in the past 6 weeks. Her PCP was concerned that this was due to her atorvastatin.  She has discontinued her use of atorvastatin since 8/5.  However, this is the first time since then that the facial swelling occurred.  She took Benadryl about an hour and a half after it began.  She reports improvement in her symptoms since arrival to the ED for the past 30 minutes.  She was prescribed an EpiPen at her last visit on 8/5 but she did not get this filled.  She was also told to take a daily antihistamine which she has not been doing.  She has not yet scheduled an appointment with her allergist.  Denies any trouble breathing or trouble swallowing, diffuse rash, known food allergies, chest pain, new environmental exposures.  At her previous visit on 8/5, she was also concerned that it could be due to the large amount of seafood that she ate the night before.  Only known medication allergy is sulfa antibiotics (and now possibly statins).  HPI  Past Medical History:  Diagnosis Date  . Coronary vasospasm (HCC) 02/14/2018    Patient Active Problem List   Diagnosis Date  Noted  . Coronary vasospasm (HCC) 02/14/2018  . Decreased left ventricular systolic function   . Chest pain 03/31/2017  . Hypokalemia 03/31/2017  . Elevated troponin 03/31/2017  . NSTEMI (non-ST elevated myocardial infarction) (HCC) 03/31/2017    Past Surgical History:  Procedure Laterality Date  . CESAREAN SECTION    . LEFT HEART CATH AND CORONARY ANGIOGRAPHY N/A 03/31/2017   Procedure: Left Heart Cath and Coronary Angiography;  Surgeon: Lennette BihariKelly, Thomas A, MD;  [facility];  Service: Cardiovascular;  Laterality: N/A;     OB History   None      Home Medications    Prior to Admission medications   Medication Sig Start Date End Date Taking? Authorizing Provider  amLODipine (NORVASC) 5 MG tablet Take 1 tablet (5 mg total) by mouth daily. 04/19/17 04/23/18 Yes Robbie LisSimmons, Brittainy M, PA-C  aspirin 81 MG chewable tablet Chew 1 tablet (81 mg total) daily by mouth. 07/05/17  Yes Corky CraftsVaranasi, Jayadeep S, MD  predniSONE (STERAPRED UNI-PAK 21 TAB) 10 MG (21) TBPK tablet Take by mouth daily. Take 6 tabs by mouth daily  for 1 days, then 5 tabs for 2 days, then 4 tabs for 2 days, then 3 tabs for 2 days, 2 tabs for 2 days, then 1 tab by mouth daily for 2 days 04/23/18   Dietrich PatesKhatri, Aleli Navedo, PA-C    Family History Family History  Problem Relation Age of  Onset  . Liver cancer Mother   . Heart disease Sister   . Hypothyroidism Sister     Social History Social History   Tobacco Use  . Smoking status: Never Smoker  . Smokeless tobacco: Never Used  Substance Use Topics  . Alcohol use: Yes    Comment: wine daily  . Drug use: No     Allergies   Lipitor [atorvastatin calcium] and Sulfa antibiotics   Review of Systems Review of Systems  Constitutional: Negative for appetite change, chills and fever.  HENT: Positive for facial swelling. Negative for ear pain, rhinorrhea, sneezing and sore throat.   Eyes: Negative for photophobia and visual disturbance.  Respiratory: Negative for  cough, chest tightness, shortness of breath and wheezing.   Cardiovascular: Negative for chest pain and palpitations.  Gastrointestinal: Negative for abdominal pain, blood in stool, constipation, diarrhea, nausea and vomiting.  Genitourinary: Negative for dysuria, hematuria and urgency.  Musculoskeletal: Negative for myalgias.  Skin: Positive for rash.  Neurological: Negative for dizziness, weakness and light-headedness.     Physical Exam Updated Vital Signs BP 113/79 (BP Location: Right Arm)   Pulse 78   Temp 98 F (36.7 C) (Oral)   Resp 18   LMP 03/25/2014 Comment: husband has vasectomy  SpO2 98%   Physical Exam  Constitutional: She appears well-developed and well-nourished. No distress.  Nontoxic appearing and in no acute distress. No signs of anaphylaxis noted.  Speaking in complete sentences without difficulty.  HENT:  Head: Normocephalic and atraumatic.  Nose: Nose normal.  Minimal R lower lip swelling.  Eyes: Conjunctivae and EOM are normal. Right eye exhibits no discharge. Left eye exhibits no discharge. No scleral icterus.  Neck: Normal range of motion. Neck supple.  Cardiovascular: Normal rate, regular rhythm, normal heart sounds and intact distal pulses. Exam reveals no gallop and no friction rub.  No murmur heard. Pulmonary/Chest: Effort normal and breath sounds normal. No respiratory distress.  Abdominal: Soft. Bowel sounds are normal. She exhibits no distension. There is no tenderness. There is no guarding.  Musculoskeletal: Normal range of motion. She exhibits no edema.  Neurological: She is alert. She exhibits normal muscle tone. Coordination normal.  Skin: Skin is warm and dry. No rash noted.  Single urticarial lesion noted to L AC area.  Psychiatric: She has a normal mood and affect.  Nursing note and vitals reviewed.    ED Treatments / Results  Labs (all labs ordered are listed, but only abnormal results are displayed) Labs Reviewed - No data to  display  EKG None  Radiology No results found.  Procedures Procedures (including critical care time)  Medications Ordered in ED Medications  predniSONE (DELTASONE) tablet 60 mg (60 mg Oral Given 04/23/18 1526)     Initial Impression / Assessment and Plan / ED Course  I have reviewed the triage vital signs and the nursing notes.  Pertinent labs & imaging results that were available during my care of the patient were reviewed by me and considered in my medical decision making (see chart for details).  Clinical Course as of Apr 23 1618  Sat Apr 23, 2018  1540 Patient states she would prefer PO medications. I feel this is reasonable based on her improvement here. Will give 60mg  PO prednisone, monitor and reassess.   [HK]    Clinical Course User Index [HK] Dietrich Pates, PA-C    52 year old female with past medical history of NSTEMI 2/2 vasospasm, who presents to ED for evaluation of facial swelling since  this morning.  States that she woke up and noticed her right lower lip swollen.  This is happened to her before.  It is actually happened about 5 times in the past 6 weeks.  For this reason, her PCP thought it was due to her atorvastatin.  She has since been off of this medication since 8/5.  She took 1 dose of p.o. Benadryl 25 mg with improvement in her symptoms.  She was prescribed an EpiPen at her prior visit on 8/5 but has not gotten it filled.  She is not taking any daily antihistamine either as she was told.  On evaluation here patient states that she feels "much better and the swelling is basically gone."  There is very minimal right lower lip swelling noted.  She speaking complete sentences without difficulty.  Nursing no signs of anaphylaxis noted.  One urticarial lesion noted to left AC.  Patient is requesting p.o. medications rather than IV.  Patient given p.o. prednisone 60 mg with ongoing improvement in her symptoms.  She is requesting discharge home.  She states that she will  get her EpiPen filled, call her allergist on Monday and take a daily antihistamine as directed.  She does not feel that she needs to be admitted for further work-up.  No recurrence noted during observation for 2 hours.  Will advise her to return to ED for any severe worsening symptoms.  Portions of this note were generated with Scientist, clinical (histocompatibility and immunogenetics). Dictation errors may occur despite best attempts at proofreading.   Final Clinical Impressions(s) / ED Diagnoses   Final diagnoses:  Allergic reaction, initial encounter    ED Discharge Orders         Ordered    predniSONE (STERAPRED UNI-PAK 21 TAB) 10 MG (21) TBPK tablet  Daily     04/23/18 1618           Dietrich Pates, PA-C 04/23/18 1621    Mesner, Barbara Cower, MD 04/23/18 1704

## 2018-04-23 NOTE — Discharge Instructions (Signed)
Return to ED for worsening symptoms, trouble breathing or trouble swallowing, severe facial swelling, chest pain. If at any point you need to use your EpiPen, call 911 afterwards. Follow-up with your allergist for further evaluation.  You can benefit from taking a daily antihistamine such as Claritin, Zyrtec or Allegra.

## 2018-04-29 DIAGNOSIS — J3081 Allergic rhinitis due to animal (cat) (dog) hair and dander: Secondary | ICD-10-CM | POA: Diagnosis not present

## 2018-04-29 DIAGNOSIS — J3089 Other allergic rhinitis: Secondary | ICD-10-CM | POA: Diagnosis not present

## 2018-04-29 DIAGNOSIS — J301 Allergic rhinitis due to pollen: Secondary | ICD-10-CM | POA: Diagnosis not present

## 2018-05-17 DIAGNOSIS — Z79899 Other long term (current) drug therapy: Secondary | ICD-10-CM | POA: Diagnosis not present

## 2018-05-17 LAB — LIPID PANEL
Chol/HDL Ratio: 2.3 ratio (ref 0.0–4.4)
Cholesterol, Total: 172 mg/dL (ref 100–199)
HDL: 74 mg/dL (ref >39)
LDL Calculated: 84 mg/dL (ref 0–99)
Triglycerides: 72 mg/dL (ref 0–149)
VLDL CHOLESTEROL CAL: 14 mg/dL (ref 5–40)

## 2018-05-19 DIAGNOSIS — L509 Urticaria, unspecified: Secondary | ICD-10-CM | POA: Diagnosis not present

## 2018-05-25 ENCOUNTER — Other Ambulatory Visit: Payer: Self-pay | Admitting: Cardiovascular Disease

## 2018-05-25 ENCOUNTER — Other Ambulatory Visit: Payer: Self-pay | Admitting: Cardiology

## 2018-05-25 DIAGNOSIS — E785 Hyperlipidemia, unspecified: Secondary | ICD-10-CM

## 2018-05-25 DIAGNOSIS — L501 Idiopathic urticaria: Secondary | ICD-10-CM | POA: Diagnosis not present

## 2018-05-25 DIAGNOSIS — J3081 Allergic rhinitis due to animal (cat) (dog) hair and dander: Secondary | ICD-10-CM | POA: Diagnosis not present

## 2018-05-25 DIAGNOSIS — J3089 Other allergic rhinitis: Secondary | ICD-10-CM | POA: Diagnosis not present

## 2018-05-25 DIAGNOSIS — J301 Allergic rhinitis due to pollen: Secondary | ICD-10-CM | POA: Diagnosis not present

## 2018-05-26 ENCOUNTER — Telehealth: Payer: Self-pay | Admitting: Cardiovascular Disease

## 2018-05-26 ENCOUNTER — Ambulatory Visit: Payer: Self-pay | Admitting: Allergy

## 2018-05-26 DIAGNOSIS — L509 Urticaria, unspecified: Secondary | ICD-10-CM | POA: Diagnosis not present

## 2018-05-26 DIAGNOSIS — N3090 Cystitis, unspecified without hematuria: Secondary | ICD-10-CM | POA: Diagnosis not present

## 2018-05-26 NOTE — Telephone Encounter (Signed)
OK that sounds good.  Please restart and check lipids/CMP in 6 weeks.

## 2018-05-26 NOTE — Telephone Encounter (Signed)
Advised patient, verbalized understanding  

## 2018-05-26 NOTE — Telephone Encounter (Signed)
Spoke with pt. Pt aware of lab results.  Notes recorded by Chilton Si, MD on 05/22/2018 at 4:03 PM EDT Cholesterol levels are not bad but her LDL should be <70. Looks like she was in the ED with facial swelling long after taking atorvastatin. I doubt that atorvastatin is the cause of her allergy. However, agree with the plan to use a PCSK9 inhibitor instead+  Pt sts that the Atorvastatin did not cause her allergic reaction. She has been of the med for 2 months and is sill having an issue. She is willing to resume Atorvastatin as previously prescribed. Adv the pt that I will fwd the update to Dr.[place] and we will call back with her response. Pt agreeable with plan.

## 2018-05-26 NOTE — Telephone Encounter (Signed)
Called pt lmtcb

## 2018-05-26 NOTE — Telephone Encounter (Signed)
Follow Up:      Patient is returning your call. 

## 2018-05-26 NOTE — Telephone Encounter (Signed)
New Message ° ° ° °Patient is calling to obtain her lab results. Please call to discuss.  °

## 2018-05-26 NOTE — Telephone Encounter (Signed)
Left message to call back  

## 2018-05-31 DIAGNOSIS — R350 Frequency of micturition: Secondary | ICD-10-CM | POA: Diagnosis not present

## 2018-06-16 ENCOUNTER — Ambulatory Visit: Payer: BLUE CROSS/BLUE SHIELD | Admitting: Cardiovascular Disease

## 2018-06-16 ENCOUNTER — Encounter: Payer: Self-pay | Admitting: Cardiovascular Disease

## 2018-06-16 VITALS — BP 114/86 | HR 70 | Ht 66.0 in | Wt 140.0 lb

## 2018-06-16 DIAGNOSIS — Z5181 Encounter for therapeutic drug level monitoring: Secondary | ICD-10-CM | POA: Diagnosis not present

## 2018-06-16 DIAGNOSIS — I201 Angina pectoris with documented spasm: Secondary | ICD-10-CM

## 2018-06-16 DIAGNOSIS — E785 Hyperlipidemia, unspecified: Secondary | ICD-10-CM | POA: Diagnosis not present

## 2018-06-16 DIAGNOSIS — I25119 Atherosclerotic heart disease of native coronary artery with unspecified angina pectoris: Secondary | ICD-10-CM

## 2018-06-16 DIAGNOSIS — I10 Essential (primary) hypertension: Secondary | ICD-10-CM

## 2018-06-16 NOTE — Patient Instructions (Signed)
Medication Instructions:  Your physician recommends that you continue on your current medications as directed. Please refer to the Current Medication list given to you today. If you need a refill on your cardiac medications before your next appointment, please call your pharmacy.   Lab work: FASTING LP/CMET IN 1 MONTH  If you have labs (blood work) drawn today and your tests are completely normal, you will receive your results only by: Marland Kitchen MyChart Message (if you have MyChart) OR . A paper copy in the mail If you have any lab test that is abnormal or we need to change your treatment, we will call you to review the results.  Testing/Procedures: NONE   Follow-Up: At [facility], you and your health needs are our priority.  As part of our continuing mission to provide you with exceptional heart care, we have created designated Provider Care Teams.  These Care Teams include your primary Cardiologist (physician) and Advanced Practice Providers (APPs -  Physician Assistants and Nurse Practitioners) who all work together to provide you with the care you need, when you need it. You will need a follow up appointment in 12 months.  Please call our office 2 months in advance to schedule this appointment.  You may see DR [facility] or one of the following Advanced Practice Providers on your designated Care Team:   Corine Shelter, PA-C Judy Pimple, [place] . Marjie Skiff, PA-C

## 2018-06-16 NOTE — Progress Notes (Signed)
Cardiology Office Note   Date:  06/16/2018   ID:  Selena Vega, DOB 10/05/1965, MRN [id number]  PCP:  Maurice Small, MD  Cardiologist:   Chilton Si, MD   Chief Complaint  Patient presents with  . Follow-up    4 months      History of Present Illness: Selena Vega is a 52 y.o. female with CAD s/p NSTEMI 03/2017 who presents for follow up.  She was previously a patient of Dr. Eldridge Dace.  She presented with NSTEMI (troponin 37) 05/2017 and was found to have a 40% LAD lesion with LVEF 45-50%.  There was concern for vasospasm in the mid LAD at an area of sharp bend in the vessel.  After IC nitroglycerin the stenosis improved to 30-40%.  She was treated with ASA and clopidogrel, amlodipine and atorvastatin.  She had an echo 05/2017 that revealed normal systolic function and diastolic function.  Her IVC was not dilated.  She was given a diuretic but this did not help her symptoms so she discontinued it.  She las saw Dr. Eldridge Dace 07/2017 and reported recurrent angina.   At her last appointment she was feeling well and exercising.  She was frustrated that she was being treated for coronary artery disease though she does not think she actually has true coronary artery disease.  She stopped taking clopidogrel, omeprazole, and atorvastatin.  She stopped taking atorvastatin 2/2 facial swelling but later determined that it was not the cause and resumed the medication.  Since her last appointment Selena Vega has been doing well.  She had one episode of chest discomfort while driving that she attributed to indigestion.  She has no exertional chest pain.  She continues to exercise 3 days/week while fast walking and also does yoga at least one day per week.  She has no chest pain or shortness of breath with these activities.  She has not experienced any lower extremity edema, orthopnea, or PND.  She saw her allergist who recommended that she stop aspirin as it may be the  cause of her urticaria and edema.  She also started Singulair at the same time.  Since then she has not had any recurrent symptoms.   Past Medical History:  Diagnosis Date  . Coronary vasospasm (HCC) 02/14/2018    Past Surgical History:  Procedure Laterality Date  . CESAREAN SECTION    . LEFT HEART CATH AND CORONARY ANGIOGRAPHY N/A 03/31/2017   Procedure: Left Heart Cath and Coronary Angiography;  Surgeon: Lennette Bihari, MD;  Location: [facility] INVASIVE CV LAB;  Service: Cardiovascular;  Laterality: N/A;     Current Outpatient Medications  Medication Sig Dispense Refill  . amLODipine (NORVASC) 5 MG tablet TAKE 1 TABLET BY MOUTH ONCE DAILY 90 tablet 3  . atorvastatin (LIPITOR) 20 MG tablet TAKE 1 TABLET BY MOUTH DAILY 90 tablet 0  . montelukast (SINGULAIR) 10 MG tablet Take 10 mg by mouth at bedtime.     No current facility-administered medications for this visit.     Allergies:   Sulfa antibiotics and Aspirin    Social History:  The patient  reports that she has never smoked. She has never used smokeless tobacco. She reports that she drinks alcohol. She reports that she does not use drugs.   Family History:  The patient's family history includes Heart disease in her sister; Hypothyroidism in her sister; Liver cancer in her mother.    ROS:  Please see the history of present illness.  Otherwise, review of systems are positive for none.   All other systems are reviewed and negative.    PHYSICAL EXAM: VS:  BP 114/86 (BP Location: Left Arm, Patient Position: Sitting, Cuff Size: Normal)   Pulse 70   Ht 5\' 6"  (1.676 m)   Wt 140 lb (63.5 kg)   LMP 03/25/2014 Comment: husband has vasectomy  BMI 22.60 kg/m  , BMI Body mass index is 22.6 kg/m. GENERAL:  Well appearing HEENT: Pupils equal round and reactive, fundi not visualized, oral mucosa unremarkable NECK:  No jugular venous distention, waveform within normal limits, carotid upstroke brisk and symmetric, no bruits LUNGS:  Clear to  auscultation bilaterally HEART:  RRR.  PMI not displaced or sustained,S1 and S2 within normal limits, no S3, no S4, no clicks, no rubs, no murmurs ABD:  Flat, positive bowel sounds normal in frequency in pitch, no bruits, no rebound, no guarding, no midline pulsatile mass, no hepatomegaly, no splenomegaly EXT:  2 plus pulses throughout, no edema, no cyanosis no clubbing SKIN:  No rashes no nodules NEURO:  Cranial nerves II through XII grossly intact, motor grossly intact throughout PSYCH:  Cognitively intact, oriented to person place and time   EKG:  EKG is ordered today. 06/16/2018: Sinus rhythm.  Rate 70 bpm.   Recent Labs: 03/28/2018: ALT 30; BUN 9; Creatinine, Ser 0.63; Potassium 4.5; Sodium 139    Lipid Panel    Component Value Date/Time   CHOL 172 05/17/2018 0813   TRIG 72 05/17/2018 0813   HDL 74 05/17/2018 0813   CHOLHDL 2.3 05/17/2018 0813   CHOLHDL 2.4 03/31/2017 0748   VLDL 20 03/31/2017 0748   LDLCALC 84 05/17/2018 0813      Wt Readings from Last 3 Encounters:  06/16/18 140 lb (63.5 kg)  04/04/18 140 lb (63.5 kg)  02/14/18 143 lb 6.4 oz (65 kg)      ASSESSMENT AND PLAN:  # Coronary vasospasm: # Hyperlipidemia: # Non-obstructive CAD: Selena Vega has been doing well.  She has not experienced any recurrent episodes of spasm.  Aspirin was discontinued as as this was causing hives.  Continue amlodipine and atorvastatin.  She will come back to have lipids and a CMP checked in 1 month.  # Shortness of breath: Symptoms occur randomly.She did not improve with the diuretic and there is no evidence of heart failure on exam or by echo.  She hasn't had any symptoms recently.   Current medicines are reviewed at length with the patient today.  The patient does not have concerns regarding medicines.  The following changes have been made:  none  Labs/ tests ordered today include:  Orders Placed This Encounter  Procedures  . Lipid panel  . Comprehensive  metabolic panel  . EKG 12-Lead     Disposition:   FU with Marline Morace C. Duke Salvia, MD, [facility] in 1 year.        Signed, Birtie Fellman C. Duke Salvia, MD, [facility]  06/16/2018 8:42 AM    [facility]

## 2018-08-23 ENCOUNTER — Other Ambulatory Visit: Payer: Self-pay | Admitting: Cardiovascular Disease

## 2018-11-06 DIAGNOSIS — N390 Urinary tract infection, site not specified: Secondary | ICD-10-CM | POA: Diagnosis not present

## 2018-11-06 DIAGNOSIS — Z3202 Encounter for pregnancy test, result negative: Secondary | ICD-10-CM | POA: Diagnosis not present

## 2018-12-18 DIAGNOSIS — R350 Frequency of micturition: Secondary | ICD-10-CM | POA: Diagnosis not present

## 2018-12-18 DIAGNOSIS — N3001 Acute cystitis with hematuria: Secondary | ICD-10-CM | POA: Diagnosis not present

## 2019-02-27 DIAGNOSIS — I201 Angina pectoris with documented spasm: Secondary | ICD-10-CM | POA: Diagnosis not present

## 2019-02-27 DIAGNOSIS — Z1231 Encounter for screening mammogram for malignant neoplasm of breast: Secondary | ICD-10-CM | POA: Diagnosis not present

## 2019-02-27 DIAGNOSIS — Z1159 Encounter for screening for other viral diseases: Secondary | ICD-10-CM | POA: Diagnosis not present

## 2019-02-27 DIAGNOSIS — I214 Non-ST elevation (NSTEMI) myocardial infarction: Secondary | ICD-10-CM | POA: Diagnosis not present

## 2019-03-21 DIAGNOSIS — N3001 Acute cystitis with hematuria: Secondary | ICD-10-CM | POA: Diagnosis not present

## 2019-03-21 DIAGNOSIS — R350 Frequency of micturition: Secondary | ICD-10-CM | POA: Diagnosis not present

## 2019-03-24 DIAGNOSIS — E785 Hyperlipidemia, unspecified: Secondary | ICD-10-CM | POA: Diagnosis not present

## 2019-03-24 DIAGNOSIS — I201 Angina pectoris with documented spasm: Secondary | ICD-10-CM | POA: Diagnosis not present

## 2019-03-24 DIAGNOSIS — Z1231 Encounter for screening mammogram for malignant neoplasm of breast: Secondary | ICD-10-CM | POA: Diagnosis not present

## 2019-03-24 DIAGNOSIS — I252 Old myocardial infarction: Secondary | ICD-10-CM | POA: Diagnosis not present

## 2019-03-24 DIAGNOSIS — I519 Heart disease, unspecified: Secondary | ICD-10-CM | POA: Diagnosis not present

## 2019-04-17 DIAGNOSIS — Z87440 Personal history of urinary (tract) infections: Secondary | ICD-10-CM | POA: Diagnosis not present

## 2019-04-17 DIAGNOSIS — Z6823 Body mass index (BMI) 23.0-23.9, adult: Secondary | ICD-10-CM | POA: Diagnosis not present

## 2019-04-25 DIAGNOSIS — Z1159 Encounter for screening for other viral diseases: Secondary | ICD-10-CM | POA: Diagnosis not present

## 2019-04-25 DIAGNOSIS — I214 Non-ST elevation (NSTEMI) myocardial infarction: Secondary | ICD-10-CM | POA: Diagnosis not present

## 2019-05-15 DIAGNOSIS — E538 Deficiency of other specified B group vitamins: Secondary | ICD-10-CM | POA: Diagnosis not present

## 2019-05-15 DIAGNOSIS — Z0000 Encounter for general adult medical examination without abnormal findings: Secondary | ICD-10-CM | POA: Diagnosis not present

## 2019-05-15 DIAGNOSIS — T783XXS Angioneurotic edema, sequela: Secondary | ICD-10-CM | POA: Diagnosis not present

## 2019-05-15 DIAGNOSIS — E559 Vitamin D deficiency, unspecified: Secondary | ICD-10-CM | POA: Diagnosis not present

## 2019-05-15 DIAGNOSIS — Z23 Encounter for immunization: Secondary | ICD-10-CM | POA: Diagnosis not present

## 2019-07-11 IMAGING — CT CT ANGIO CHEST-ABD-PELV FOR DISSECTION W/ AND WO/W CM
2 of 8 series · 13 of 46 positions shown, 15 images · IV contrast (APPLIED)
Comparison: Chest radiograph dated 03/30/2017

CLINICAL DATA: 50-year-old female with sharp pain in the chest.
Evaluate for dissection.

EXAM:
CT ANGIOGRAPHY CHEST, ABDOMEN AND PELVIS
TECHNIQUE: Multidetector CT imaging through the chest, abdomen and pelvis was
performed using the standard protocol during bolus administration of
intravenous contrast. Multiplanar reconstructed images and MIPs were
obtained and reviewed to evaluate the vascular anatomy.
CONTRAST:  100 cc Isovue 370

[Series 6: axial arterial · axial · arterial · 0.75mm/px · z∈[-380,+130]mm · 10 of 208 slices shown, 12 images]
[im 19/208  soft-tissue]
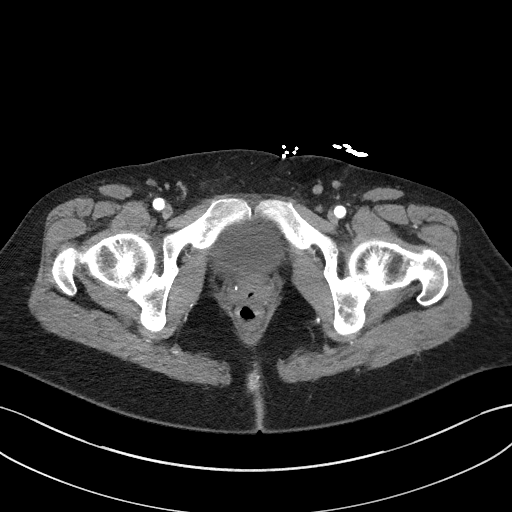
[im 19/208  bone]
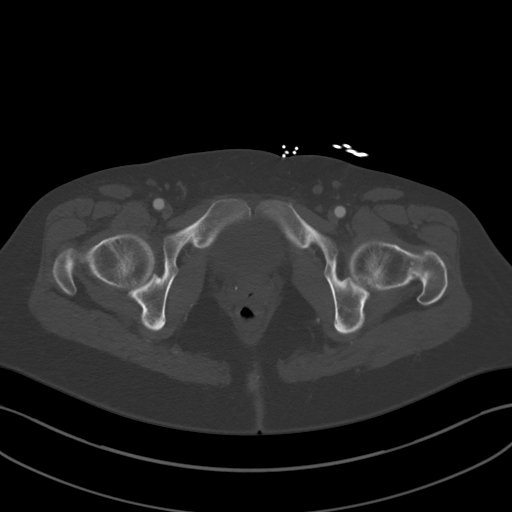
[im 38/208  soft-tissue]
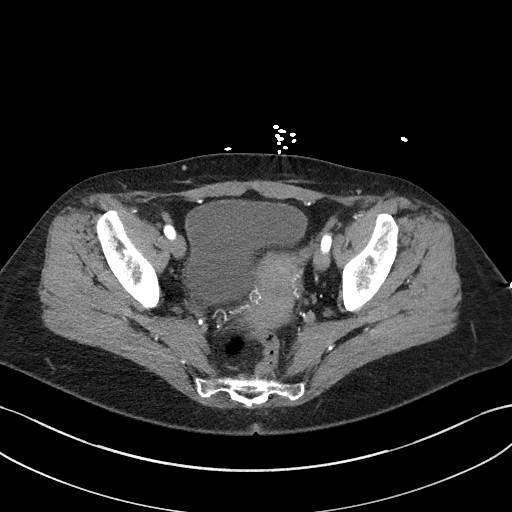
[im 57/208  soft-tissue]
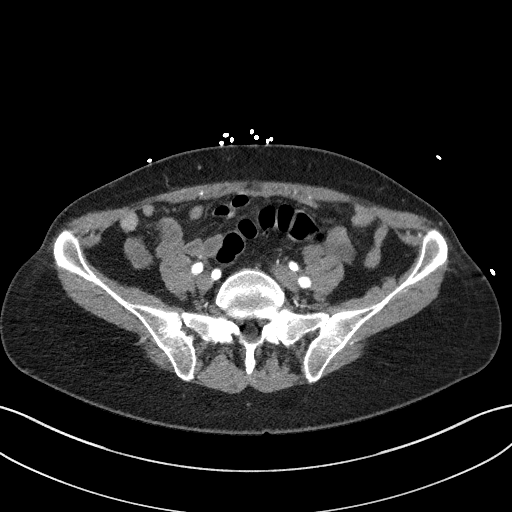
[im 76/208  soft-tissue]
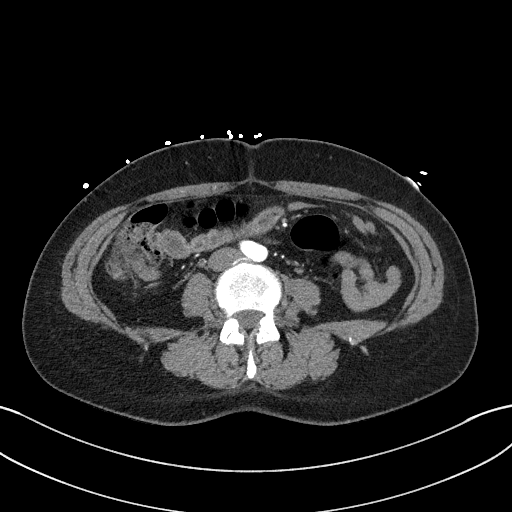
[im 95/208  soft-tissue]
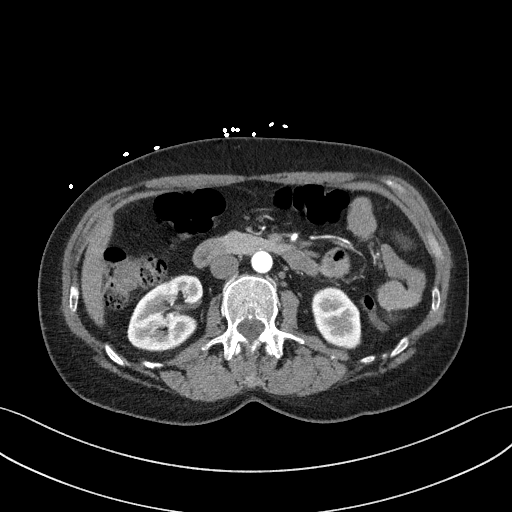
[im 113/208  soft-tissue]
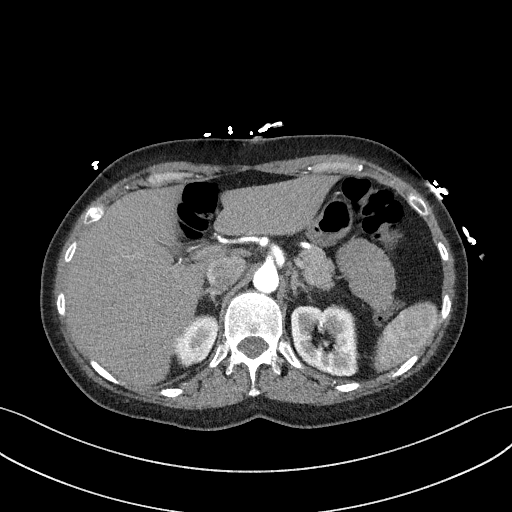
[im 132/208  soft-tissue]
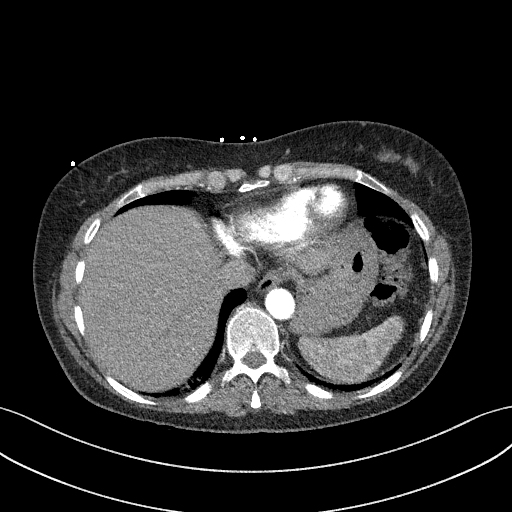
[im 151/208  soft-tissue]
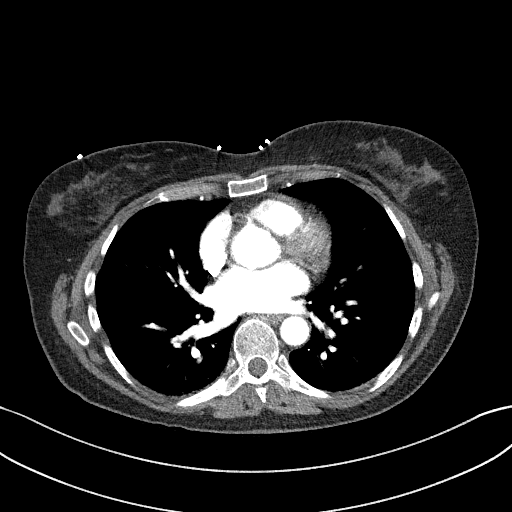
[im 170/208  soft-tissue]
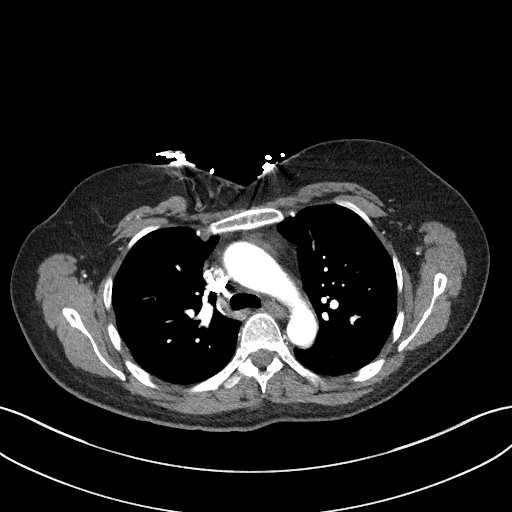
[im 170/208  bone]
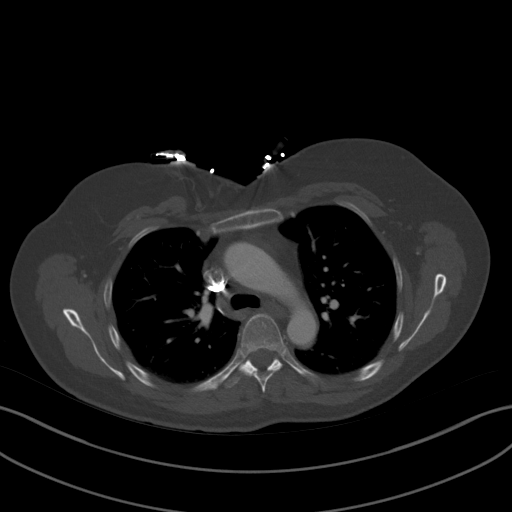
[im 189/208  soft-tissue]
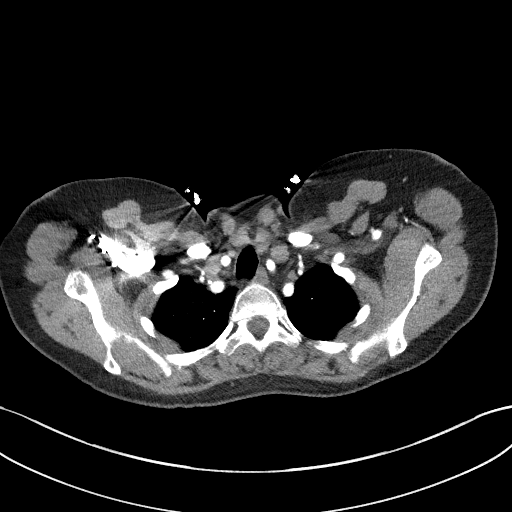

[Series 8: coronals · coronal · 0.71mm/px · 3 of 112 slices shown]
[im 28/112  soft-tissue]
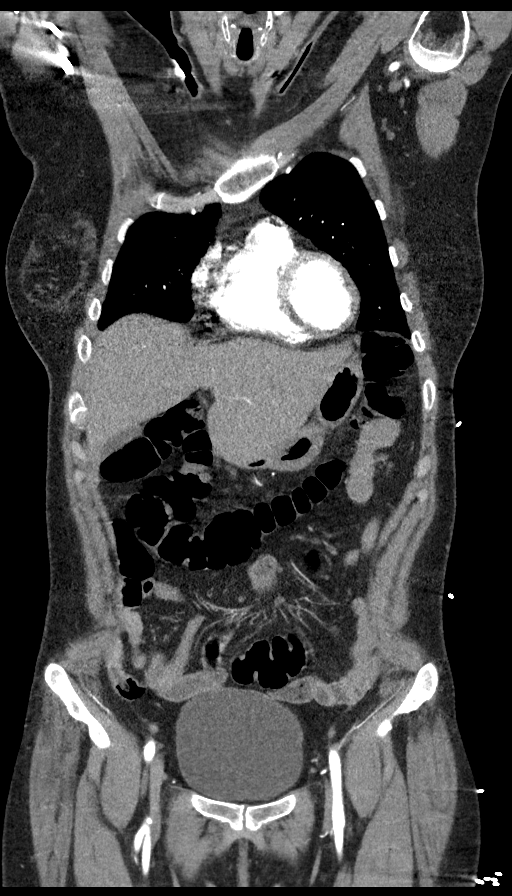
[im 56/112  soft-tissue]
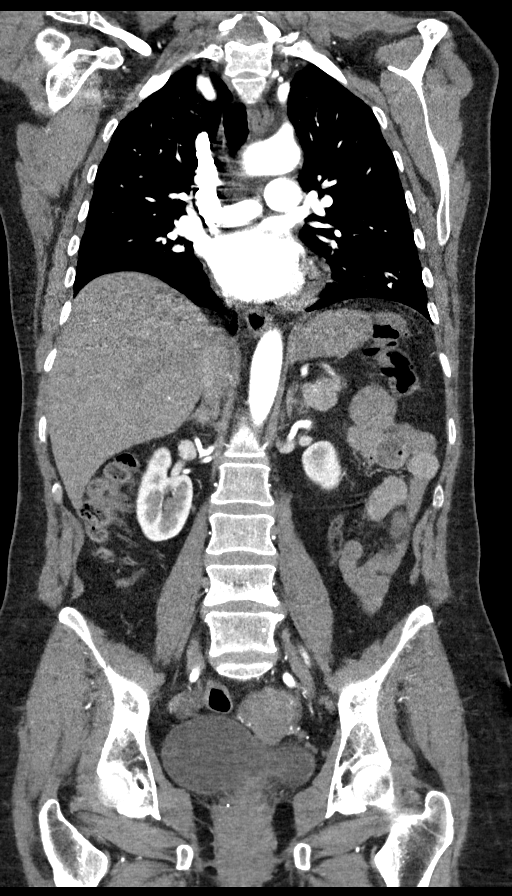
[im 84/112  soft-tissue]
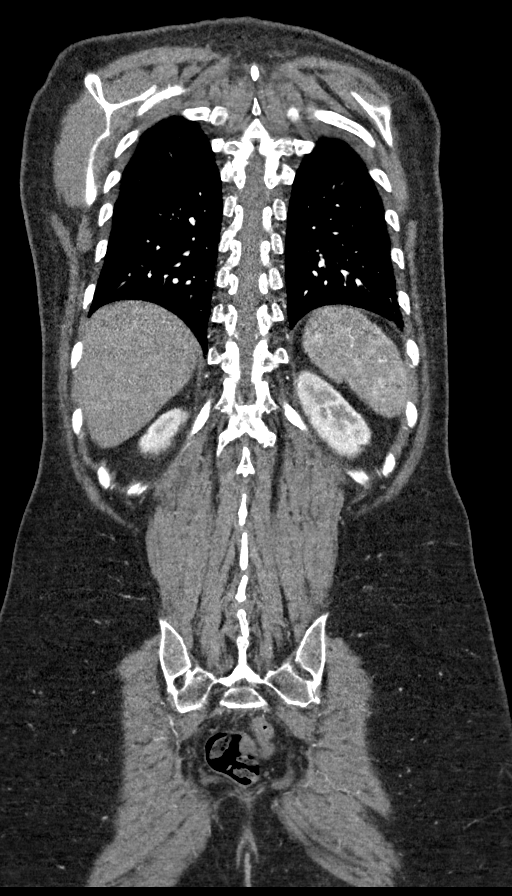

[13 of 46 positions shown; findings below may reference images not displayed]

FINDINGS: CTA CHEST FINDINGS

Cardiovascular: There is no cardiomegaly or pericardial effusion.
The thoracic aorta is unremarkable. No aneurysmal dilatation or
dissection. The origins of the great vessels of the aortic arch
appear patent. No CT evidence of central pulmonary artery embolus.

Mediastinum/Nodes: There is no hilar or mediastinal adenopathy. The
esophagus and the thyroid gland are grossly unremarkable. No
mediastinal fluid collection or hematoma.

Lungs/Pleura: The lungs are clear. There is no pleural effusion or
pneumothorax. The central airways are patent.

Musculoskeletal: No chest wall abnormality. No acute or significant
osseous findings.

Review of the MIP images confirms the above findings.

CTA ABDOMEN AND PELVIS FINDINGS

VASCULAR

Aorta: Normal caliber aorta without aneurysm, dissection, vasculitis
or significant stenosis.

Celiac: There is focal aneurysmal dilatation of the celiac axis
measuring up to 9 mm in diameter approximately 17 mm from the
origin. No dissection. The celiac axis is patent. There is an
accessory left hepatic artery arising from the left gastric artery.
An accessory right hepatic artery arising from the SMA.

SMA: Patent without evidence of aneurysm, dissection, vasculitis or
significant stenosis.

Renals: Both renal arteries are patent without evidence of aneurysm,
dissection, vasculitis, fibromuscular dysplasia or significant
stenosis.

IMA: Patent without evidence of aneurysm, dissection, vasculitis or
significant stenosis.

Inflow: Patent without evidence of aneurysm, dissection, vasculitis
or significant stenosis.

Veins: No obvious venous abnormality within the limitations of this
arterial phase study.

Review of the MIP images confirms the above findings.

NON-VASCULAR

No intra-abdominal free air or free fluid.

Hepatobiliary: No focal liver abnormality is seen. No gallstones,
gallbladder wall thickening, or biliary dilatation.

Pancreas: Unremarkable. No pancreatic ductal dilatation or
surrounding inflammatory changes.

Spleen: Normal in size without focal abnormality.

Adrenals/Urinary Tract: Adrenal glands are unremarkable. Kidneys are
normal, without renal calculi, focal lesion, or hydronephrosis.
Bladder is unremarkable.

Stomach/Bowel: Small sigmoid diverticula without active inflammatory
changes. There is no evidence of bowel obstruction or active
inflammation. Normal appendix.

Lymphatic: No adenopathy.

Reproductive: Ill-defined uterine fibroids. Ultrasound may provide
better evaluation of the pelvic structures. The ovaries are grossly
unremarkable.

Other: None

Musculoskeletal: Degenerative changes and sclerotic changes of the
L4. Mild diffuse disc bulge at L4-5. No acute osseous pathology.

Review of the MIP images confirms the above findings.
IMPRESSION: 1. No acute intrathoracic, abdominal, or pelvic pathology. No aortic
aneurysm or dissection. No CT evidence of central pulmonary artery
embolus.
2. Mild aneurysmal dilatation of the proximal celiac axis measuring
up to 9 mm in diameter.
3. Small scattered sigmoid diverticula. No active inflammation or
bowel obstruction. Normal appendix.
4. Small uterine fibroids. Ultrasound may provide better evaluation
of the pelvic structures.

## 2019-07-11 IMAGING — CR DG CHEST 2V
2 series · 2 of 2 positions shown · non-contrast
Comparison: None.

CLINICAL DATA: Chest pain

EXAM:
CHEST  2 VIEW

[w chest pa]
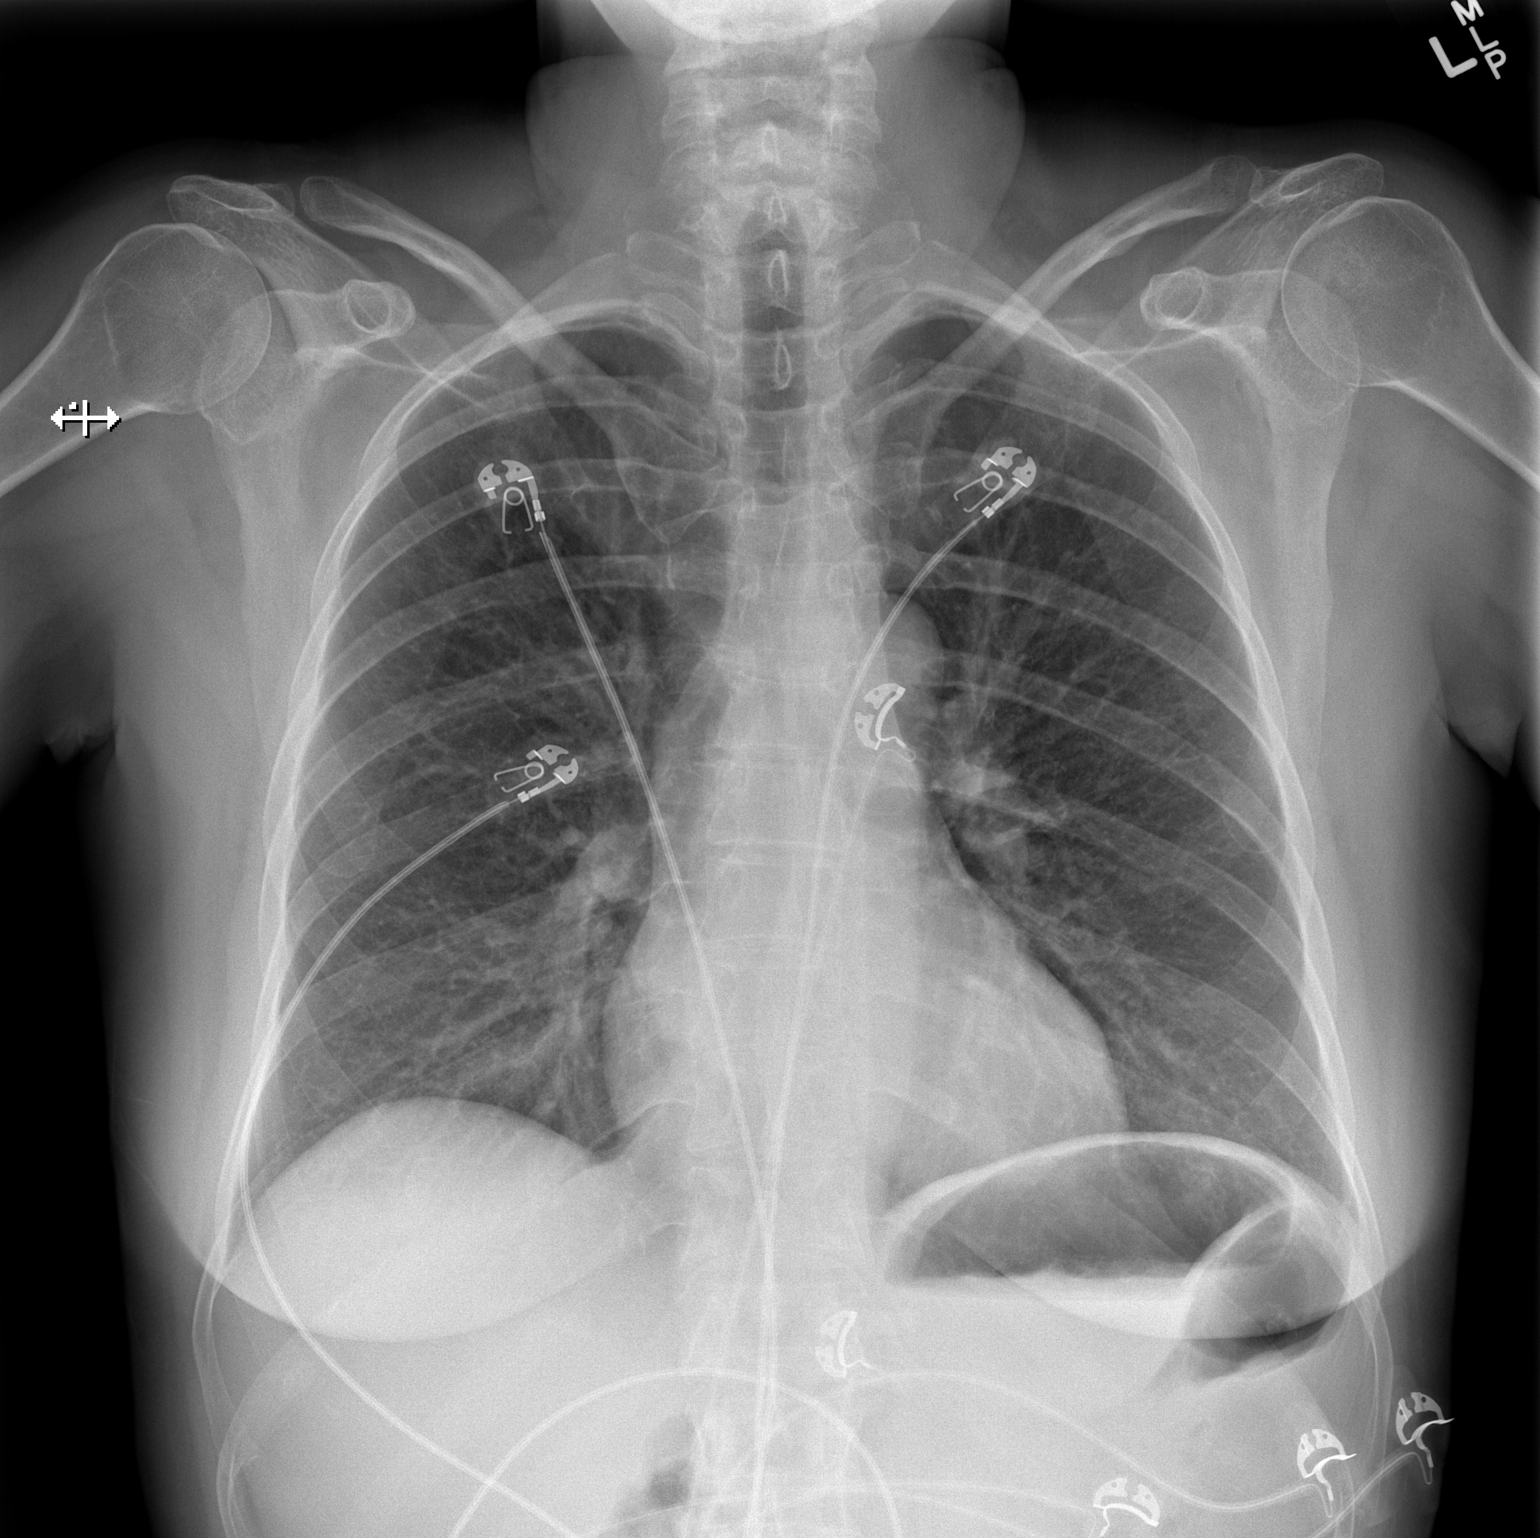

[w chest lat]
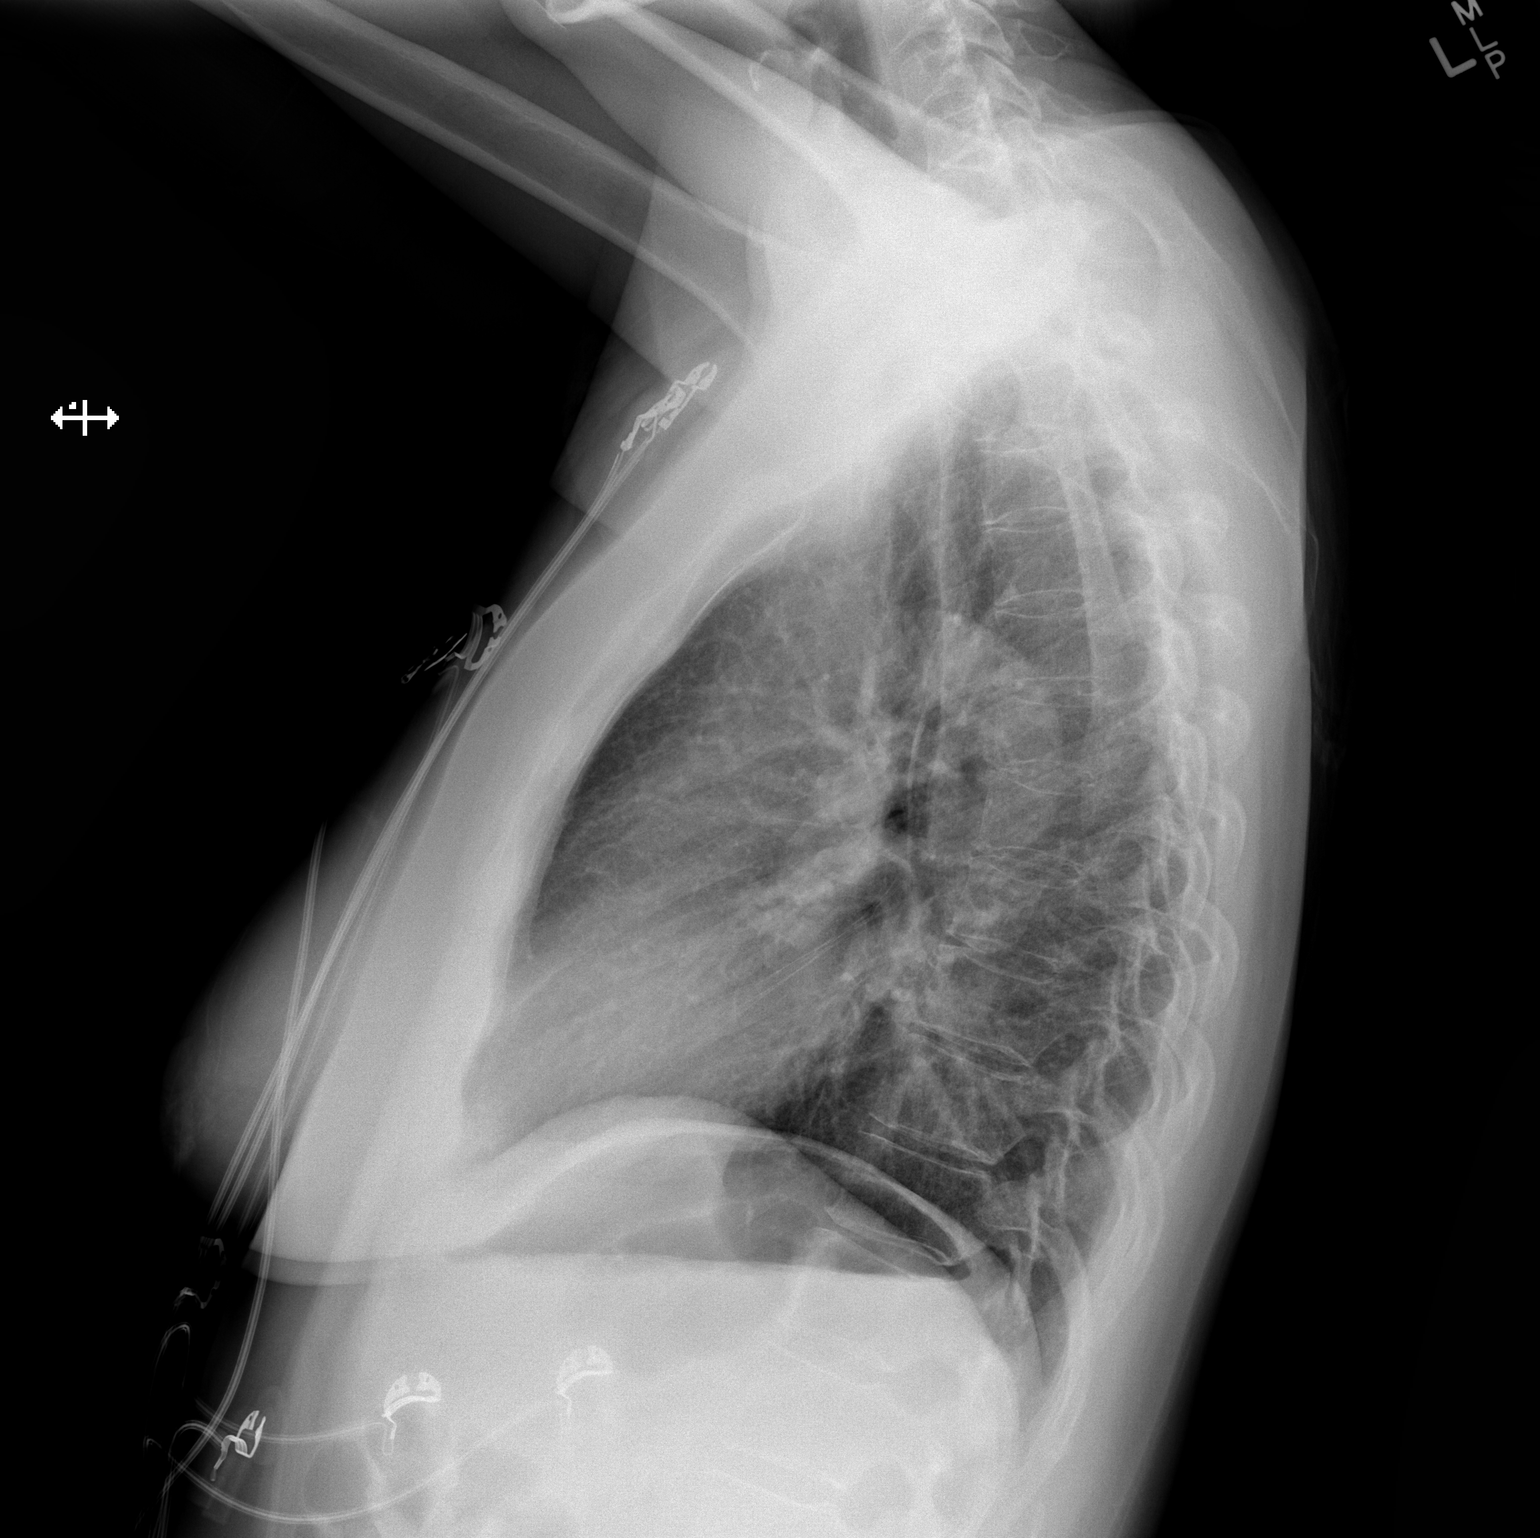

[2 of 2 positions shown; findings below may reference images not displayed]

FINDINGS: The heart size and mediastinal contours are within normal limits.
Both lungs are clear. The visualized skeletal structures are
unremarkable.
IMPRESSION: No active cardiopulmonary disease.

## 2019-08-06 DIAGNOSIS — H1031 Unspecified acute conjunctivitis, right eye: Secondary | ICD-10-CM | POA: Diagnosis not present

## 2019-09-10 DIAGNOSIS — R21 Rash and other nonspecific skin eruption: Secondary | ICD-10-CM | POA: Diagnosis not present

## 2019-09-15 ENCOUNTER — Other Ambulatory Visit: Payer: Self-pay | Admitting: Cardiovascular Disease

## 2019-10-17 DIAGNOSIS — F411 Generalized anxiety disorder: Secondary | ICD-10-CM | POA: Diagnosis not present

## 2019-10-17 DIAGNOSIS — Z6822 Body mass index (BMI) 22.0-22.9, adult: Secondary | ICD-10-CM | POA: Diagnosis not present

## 2019-11-28 DIAGNOSIS — D229 Melanocytic nevi, unspecified: Secondary | ICD-10-CM | POA: Diagnosis not present

## 2019-11-28 DIAGNOSIS — L570 Actinic keratosis: Secondary | ICD-10-CM | POA: Diagnosis not present

## 2019-11-28 DIAGNOSIS — D485 Neoplasm of uncertain behavior of skin: Secondary | ICD-10-CM | POA: Diagnosis not present

## 2019-12-22 DIAGNOSIS — I10 Essential (primary) hypertension: Secondary | ICD-10-CM | POA: Diagnosis not present

## 2019-12-22 DIAGNOSIS — E785 Hyperlipidemia, unspecified: Secondary | ICD-10-CM | POA: Diagnosis not present

## 2019-12-22 DIAGNOSIS — F411 Generalized anxiety disorder: Secondary | ICD-10-CM | POA: Diagnosis not present

## 2019-12-28 DIAGNOSIS — D225 Melanocytic nevi of trunk: Secondary | ICD-10-CM | POA: Diagnosis not present

## 2020-01-10 ENCOUNTER — Other Ambulatory Visit: Payer: Self-pay | Admitting: Family Medicine

## 2020-01-10 DIAGNOSIS — Z1231 Encounter for screening mammogram for malignant neoplasm of breast: Secondary | ICD-10-CM

## 2020-03-16 DIAGNOSIS — R399 Unspecified symptoms and signs involving the genitourinary system: Secondary | ICD-10-CM | POA: Diagnosis not present

## 2020-04-22 DIAGNOSIS — R519 Headache, unspecified: Secondary | ICD-10-CM | POA: Diagnosis not present
